# Patient Record
Sex: Female | Born: 1995
Health system: Southern US, Community
[De-identification: ages and names within clinical notes are randomized; demographics above are authoritative.]

## PROBLEM LIST (undated history)

## (undated) DIAGNOSIS — G8929 Other chronic pain: Secondary | ICD-10-CM

## (undated) DIAGNOSIS — F419 Anxiety disorder, unspecified: Secondary | ICD-10-CM

## (undated) DIAGNOSIS — F329 Major depressive disorder, single episode, unspecified: Secondary | ICD-10-CM

## (undated) DIAGNOSIS — R519 Headache, unspecified: Secondary | ICD-10-CM

## (undated) DIAGNOSIS — F32A Depression, unspecified: Secondary | ICD-10-CM

## (undated) DIAGNOSIS — T7840XA Allergy, unspecified, initial encounter: Secondary | ICD-10-CM

## (undated) HISTORY — DX: Other chronic pain: G89.29

## (undated) HISTORY — DX: Depression, unspecified: F32.A

## (undated) HISTORY — DX: Anxiety disorder, unspecified: F41.9

## (undated) HISTORY — DX: Allergy, unspecified, initial encounter: T78.40XA

## (undated) HISTORY — DX: Headache, unspecified: R51.9

## (undated) HISTORY — DX: Major depressive disorder, single episode, unspecified: F32.9

## (undated) HISTORY — PX: THERAPEUTIC ABORTION: SHX798

---

## 2015-04-15 ENCOUNTER — Ambulatory Visit (HOSPITAL_COMMUNITY)
Admission: AD | Admit: 2015-04-15 | Discharge: 2015-04-15 | Disposition: A | Discharge status: Home / Self Care | Payer: No Typology Code available for payment source | Attending: Psychiatry | Admitting: Psychiatry

## 2015-04-15 DIAGNOSIS — F419 Anxiety disorder, unspecified: Secondary | ICD-10-CM | POA: Diagnosis present
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
## 2015-08-02 DIAGNOSIS — F341 Dysthymic disorder: Secondary | ICD-10-CM | POA: Diagnosis not present

## 2015-10-04 ENCOUNTER — Ambulatory Visit (INDEPENDENT_AMBULATORY_CARE_PROVIDER_SITE_OTHER): Payer: BLUE CROSS/BLUE SHIELD | Admitting: Physician Assistant

## 2015-10-04 VITALS — BP 98/62 | HR 71 | Temp 98.3°F | Resp 16 | Ht 65.5 in | Wt 151.0 lb

## 2015-10-04 DIAGNOSIS — F329 Major depressive disorder, single episode, unspecified: Secondary | ICD-10-CM

## 2015-10-04 DIAGNOSIS — F32A Depression, unspecified: Secondary | ICD-10-CM

## 2015-10-04 MED ORDER — FLUOXETINE HCL 40 MG PO CAPS: 40.0000 mg | ORAL_CAPSULE | Freq: Every day | ORAL | Status: DC

## 2015-10-04 NOTE — Patient Instructions (Addendum)
Reche[place]. Continue therapy and follow up here with Benny LennertSarah Weber, PA-C.         IF you received an x-ray today, you will receive an invoice from [facility]. Please contact [facility] at [phone] with questions or concerns regarding your invoice.   IF you received labwork today, you will receive an invoice from [facility]. Please contact [facility] at [phone] with questions or concerns regarding your invoice.   Our billing staff will not be able to assist you with questions regarding bills from these companies.  You will be contacted with the lab results as soon as they are available. The fastest way to get your results is to activate your My Chart account. Instructions are located on the last page of this paperwork. If you have not heard from us regarding the results in 2 weeks, please contact this office.

## 2015-10-04 NOTE — Progress Notes (Signed)
Subjective:    Patient ID: Donna Hodges, female    DOB: 06/15/1995, 20 y.o.   MRN: [id number]  HPI Patient presents toaday for depression and medication refill. Pt classifies as transexual and is gender nonconforming. Pt is a patient of [facility], but the past two [facility]. Pt runs out of medicine tomorrow and from past experience, pt knows that they will not feel well without med and is afraid of harming oneself. It has been three months since pt has seen a provider for medication.   Earlier today pt thought about killing themselves (Due to gender nonconforming, patient refers to themselves as "them" vs "he or she"). While waiting in the lobby, older people were discussing how gay people shouldn't exist and talking loud about gay people. Pt responded and felt extremely upset afterwards. The pt then had thoughts of killing themselves due to other added stressors of the week like issues with their mother but currently does not have intent or plan.   Takes prozac 20 mg for 6 mths. Was increased from 10 to 20 two months ago. Has not seen a huge difference with the increase. Does state has more energy.Notes that they have been finding joy in things such as gardening, journaling, and hanging out with friends.  Has not noticed any change in interest levels since beginning meds. Feeels sad 3 d/wk and on these days will just lay in bed or eat food. Pt stats that when they are really sad they think of harming themselves and does things like put pressure on their leg to feel better but no other form of self harm. Since medication admin, pt has had 5-6 thoughts of killing themselves with no intent or plan. Pt has not activley attempted to kill themselves.   Of note, pt quit school last semester, felt like they couldn't write papers anymore.Is currently unemployed. Typically has a good relationship with family but is having problems with mom and friends and feels like they do not have as  many friends anymore.   Tovi Hanock is [facility]. Pt sees therapist via webcam every two weeks and has consistently done so for the past two months.    Pt also mentions that they currently have multiple sex partners, both female and female, but only uses condom sometimes. Is not currently pregnant. Was tested for STDs yesterday.    Review of Systems  Constitutional: Positive for fatigue. Negative for fever and unexpected weight change.  Respiratory: Negative for cough, choking, chest tightness and shortness of breath.   Neurological: Positive for dizziness and light-headedness.  Psychiatric/Behavioral: Positive for suicidal ideas (today) and sleep disturbance (did not sleep well last night). Negative for hallucinations and self-injury. The patient is nervous/anxious.        Objective:   Physical Exam  Constitutional: She is oriented to person, place, and time. She appears well-developed and well-nourished.  HENT:  Head: Normocephalic and atraumatic.  Eyes: Pupils are equal, round, and reactive to light.  Cardiovascular: Normal rate, regular rhythm, normal heart sounds and normal pulses.   Pulmonary/Chest: Effort normal and breath sounds normal.  Lymphadenopathy:       Head (right side): No tonsillar, no preauricular and no posterior auricular adenopathy present.       Head (left side): No tonsillar, no preauricular and no posterior auricular adenopathy present.    She has no cervical adenopathy.  Neurological: She is alert and oriented to person, place, and time.  Psychiatric: Her  speech is normal. Her mood appears anxious. She is withdrawn. She exhibits a depressed mood. She expresses no suicidal ideation.    Depression screen PHQ 2/9 10/04/2015  Decreased Interest 1  Down, Depressed, Hopeless 1  PHQ - 2 Score 2  Altered sleeping 1  Tired, decreased energy 1  Change in appetite 2  Feeling bad or failure about yourself  2  Trouble concentrating 1  Moving slowly  or fidgety/restless 1  Suicidal thoughts 1  PHQ-9 Score 11        Assessment & Plan:  1. Depression -Pt notes that depression has gotten better since medication was started, notes a 60% increase in mood. However, pt has not noticed much improvement with the increase from 10-20 mg. Will increase meds to 40mg . Pt chooses to follow up with Benny LennertSarah Weber, PA-C at the end of July before pt leaves for a trip in August. Instructed that if symptoms worsen to seek medical care sooner.  -Prescribed FLUoxetine (PROZAC) 40 MG capsule; Take 1 capsule (40 mg total) by mouth daily.  Dispense: 90 capsule; Refill: 0

## 2015-10-07 ENCOUNTER — Emergency Department (HOSPITAL_COMMUNITY)
Admission: EM | Admit: 2015-10-07 | Discharge: 2015-10-07 | Disposition: A | Discharge status: Other Acute Inpatient Hospital | Payer: BLUE CROSS/BLUE SHIELD | Attending: Emergency Medicine | Admitting: Emergency Medicine

## 2015-10-07 ENCOUNTER — Encounter (HOSPITAL_COMMUNITY): Payer: Self-pay | Admitting: *Deleted

## 2015-10-07 ENCOUNTER — Observation Stay (HOSPITAL_COMMUNITY)
Admission: AD | Admit: 2015-10-07 | Discharge: 2015-10-08 | Disposition: A | Discharge status: Home / Self Care | Payer: BLUE CROSS/BLUE SHIELD | Source: Intra-hospital | Attending: Psychiatry | Admitting: Psychiatry

## 2015-10-07 DIAGNOSIS — Z79899 Other long term (current) drug therapy: Secondary | ICD-10-CM | POA: Insufficient documentation

## 2015-10-07 DIAGNOSIS — F321 Major depressive disorder, single episode, moderate: Principal | ICD-10-CM | POA: Diagnosis present

## 2015-10-07 DIAGNOSIS — F1290 Cannabis use, unspecified, uncomplicated: Secondary | ICD-10-CM | POA: Diagnosis not present

## 2015-10-07 DIAGNOSIS — F32A Depression, unspecified: Secondary | ICD-10-CM

## 2015-10-07 DIAGNOSIS — Z91018 Allergy to other foods: Secondary | ICD-10-CM | POA: Insufficient documentation

## 2015-10-07 DIAGNOSIS — F329 Major depressive disorder, single episode, unspecified: Secondary | ICD-10-CM

## 2015-10-07 DIAGNOSIS — R45851 Suicidal ideations: Secondary | ICD-10-CM

## 2015-10-07 DIAGNOSIS — Z818 Family history of other mental and behavioral disorders: Secondary | ICD-10-CM | POA: Diagnosis not present

## 2015-10-07 DIAGNOSIS — F419 Anxiety disorder, unspecified: Secondary | ICD-10-CM | POA: Diagnosis not present

## 2015-10-07 LAB — COMPREHENSIVE METABOLIC PANEL
ALT: 18 U/L (ref 14–54)
AST: 23 U/L (ref 15–41)
Albumin: 4.3 g/dL (ref 3.5–5.0)
Alkaline Phosphatase: 37 U/L — ABNORMAL LOW (ref 38–126)
Anion gap: 6 (ref 5–15)
BUN: 13 mg/dL (ref 6–20)
CO2: 26 mmol/L (ref 22–32)
Calcium: 9.5 mg/dL (ref 8.9–10.3)
Chloride: 105 mmol/L (ref 101–111)
Creatinine, Ser: 0.68 mg/dL (ref 0.44–1.00)
GFR calc Af Amer: >60 mL/min (ref >60)
GFR calc non Af Amer: >60 mL/min (ref >60)
Glucose, Bld: 84 mg/dL (ref 65–99)
Potassium: 4 mmol/L (ref 3.5–5.1)
Sodium: 137 mmol/L (ref 135–145)
Total Bilirubin: 0.7 mg/dL (ref 0.3–1.2)
Total Protein: 8 g/dL (ref 6.5–8.1)

## 2015-10-07 LAB — ETHANOL: Alcohol, Ethyl (B): <5 mg/dL (ref <5)

## 2015-10-07 LAB — RAPID URINE DRUG SCREEN, HOSP PERFORMED
Amphetamines: NOT DETECTED
Barbiturates: NOT DETECTED
Benzodiazepines: NOT DETECTED
Cocaine: NOT DETECTED
Opiates: NOT DETECTED
Tetrahydrocannabinol: NOT DETECTED

## 2015-10-07 LAB — CBC
HCT: 41.3 % (ref 36.0–46.0)
Hemoglobin: 14 g/dL (ref 12.0–15.0)
MCH: 27.9 pg (ref 26.0–34.0)
MCHC: 33.9 g/dL (ref 30.0–36.0)
MCV: 82.4 fL (ref 78.0–100.0)
Platelets: 226 10*3/uL (ref 150–400)
RBC: 5.01 MIL/uL (ref 3.87–5.11)
RDW: 12.8 % (ref 11.5–15.5)
WBC: 4.5 10*3/uL (ref 4.0–10.5)

## 2015-10-07 LAB — POC URINE PREG, ED: Preg Test, Ur: NEGATIVE

## 2015-10-07 MED ORDER — ONDANSETRON HCL 4 MG PO TABS: 4.0000 mg | ORAL_TABLET | Freq: Three times a day (TID) | ORAL | Status: DC | PRN

## 2015-10-07 MED ORDER — ZOLPIDEM TARTRATE 5 MG PO TABS
5.0000 mg | ORAL_TABLET | Freq: Every day | ORAL | Status: DC
  Administered 2015-10-07: 5 mg via ORAL
  Filled 2015-10-07: qty 1

## 2015-10-07 MED ORDER — FLUCONAZOLE 100 MG PO TABS
150.0000 mg | ORAL_TABLET | Freq: Once | ORAL | Status: AC
  Administered 2015-10-07: 150 mg via ORAL
  Filled 2015-10-07 (×2): qty 1.5

## 2015-10-07 MED ORDER — MAGNESIUM HYDROXIDE 400 MG/5ML PO SUSP: 30.0000 mL | Freq: Every day | ORAL | Status: DC | PRN

## 2015-10-07 MED ORDER — LORAZEPAM 1 MG PO TABS: 1.0000 mg | ORAL_TABLET | Freq: Three times a day (TID) | ORAL | Status: DC | PRN

## 2015-10-07 MED ORDER — IBUPROFEN 600 MG PO TABS: 600.0000 mg | ORAL_TABLET | Freq: Three times a day (TID) | ORAL | Status: DC | PRN

## 2015-10-07 MED ORDER — ALUM & MAG HYDROXIDE-SIMETH 200-200-20 MG/5ML PO SUSP: 30.0000 mL | ORAL | Status: DC | PRN

## 2015-10-07 MED ORDER — ACETAMINOPHEN 325 MG PO TABS: 650.0000 mg | ORAL_TABLET | Freq: Four times a day (QID) | ORAL | Status: DC | PRN

## 2015-10-07 MED ORDER — HYDROXYZINE HCL 25 MG PO TABS
25.0000 mg | ORAL_TABLET | Freq: Four times a day (QID) | ORAL | Status: DC | PRN
  Administered 2015-10-07: 25 mg via ORAL
  Filled 2015-10-07: qty 1

## 2015-10-07 MED ORDER — IBUPROFEN 200 MG PO TABS: 600.0000 mg | ORAL_TABLET | Freq: Three times a day (TID) | ORAL | Status: DC | PRN
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
## 2015-10-07 NOTE — Progress Notes (Signed)
D.  Pt pleasant but guarded on approach, denies complaints at this time.  Pt has remained in bed, briefly sat up and watched TV for short time.  A.  Support and encouragement offered, medication given as ordered  R.  Pt remains safe on unit, will continue to monitor.

## 2015-10-07 NOTE — ED Notes (Signed)
Pt states she began feeling suicidal last night. Pt called suicide hotline, states she felt better but then had suicidal thoughts again this morning. Pt started taking 40mg  Prozac 3 days ago, which she states made her feel worse. Pt states she feels alone and has less friends than before. Pt recently quit school at [employer] due to depression and is unemployed. Pt denies drug or alcohol use.

## 2015-10-07 NOTE — ED Provider Notes (Signed)
CSN: [id number]     Arrival date & time 10/07/15  1155 History   First MD Initiated Contact with Patient 10/07/15 1236     Chief Complaint  Patient presents with  . Suicidal    Patient is a 20 y.o. female presenting with mental health disorder. The history is provided by the patient.  Mental Health Problem Presenting symptoms: suicidal thoughts   Degree of incapacity (severity):  Moderate Onset quality:  Gradual Duration:  1 day Timing:  Constant Progression:  Worsening Chronicity:  New Relieved by:  Nothing Worsened by:  Nothing tried Associated symptoms: no abdominal pain, no chest pain and no headaches   Patient reports increasing SI over past day No suicide attempt as of yet No other complaints She denies fever/vomiting/cp/HA/abd pain  Past Medical History  Diagnosis Date  . Anxiety   . Depression   . Allergy     Seasonal allergies   History reviewed. No pertinent past surgical history. Family History  Problem Relation Age of Onset  . Diabetes Mother   . Cancer Father   . Hyperlipidemia Father   . Mental illness Sister   . Diabetes Brother   . Mental illness Brother    Social History  Substance Use Topics  . Smoking status: Never Smoker   . Smokeless tobacco: Never Used  . Alcohol Use: No   OB History    No data available     Review of Systems  Constitutional: Negative for fever.  Cardiovascular: Negative for chest pain.  Gastrointestinal: Negative for abdominal pain.  Neurological: Negative for headaches.  Psychiatric/Behavioral: Positive for suicidal ideas.  All other systems reviewed and are negative.     Allergies  Soy allergy  Home Medications   Prior to Admission medications   Medication Sig Start Date End Date Taking? Authorizing Provider  FLUoxetine (PROZAC) 40 MG capsule Take 1 capsule (40 mg total) by mouth daily. 10/04/15  Yes Magdalene RiverBrittany D Wiseman, PA-C  MICONAZOLE NITRATE VAGINAL (MONISTAT 3) 4 % CREA Place 1 application vaginally  daily.   Yes Historical Provider, MD   BP 104/65 mmHg  Pulse 72  Temp(Src) 98.3 F (36.8 C) (Oral)  Resp 18  SpO2 100%  LMP 09/13/2015 Physical Exam CONSTITUTIONAL: Well developed/well nourished HEAD: Normocephalic/atraumatic EYES: EOMI ENMT: Mucous membranes moist NECK: supple no meningeal signs SPINE/BACK:entire spine nontender CV: S1/S2 noted, no murmurs/rubs/gallops noted LUNGS: Lungs are clear to auscultation bilaterally, no apparent distress ABDOMEN: soft, nontender NEURO: Pt is awake/alert/appropriate, moves all extremitiesx4.  No facial droop.   EXTREMITIES: pulses normal/equal, full ROM SKIN: warm, color normal PSYCH: flat affect  ED Course  Procedures  Labs Review Labs Reviewed  COMPREHENSIVE METABOLIC PANEL - Abnormal; Notable for the following:    Alkaline Phosphatase 37 (*)    All other components within normal limits  ETHANOL  CBC  URINE RAPID DRUG SCREEN, HOSP PERFORMED  POC URINE PREG, ED    I have personally reviewed and evaluated these lab results as part of my medical decision-making.     Pt medically stable at this time Appropriate for psych evaluation  MDM   Final diagnoses:  Major depressive disorder, single episode, moderate degree (HCC)    Nursing notes including past medical history and social history reviewed and considered in documentation Labs/vital reviewed myself and considered during evaluation     Zadie Rhineonald Grayce Budden, MD 10/07/15 1404

## 2015-10-08 DIAGNOSIS — F321 Major depressive disorder, single episode, moderate: Secondary | ICD-10-CM | POA: Diagnosis not present

## 2015-10-08 MED ORDER — FLUOXETINE HCL 20 MG PO CAPS
40.0000 mg | ORAL_CAPSULE | Freq: Every day | ORAL | Status: DC
  Administered 2015-10-08: 40 mg via ORAL

## 2015-10-08 MED ORDER — FLUOXETINE HCL 20 MG PO CAPS
ORAL_CAPSULE | ORAL | Status: AC
  Filled 2015-10-08: qty 2

## 2015-10-08 MED ORDER — FLUOXETINE HCL 40 MG PO CAPS: 40.0000 mg | ORAL_CAPSULE | Freq: Every day | ORAL | Status: DC

## 2015-10-08 MED ORDER — MICONAZOLE NITRATE 4 % VA CREA: 1.0000 "application " | TOPICAL_CREAM | Freq: Every day | VAGINAL | Status: DC

## 2015-10-08 NOTE — Progress Notes (Signed)
Pt d/c. Family member to pick up. All items returned. D/C instructions given.
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
## 2015-10-08 NOTE — Discharge Instructions (Addendum)
Outpatient resources given Counselor provided

## 2015-10-08 NOTE — Progress Notes (Signed)
D:Pt reports that she recently had her prozac medication increased. Pt reports that she started feeling suicidal a couple days ago calling the suicide hotline feeling better then feeling worse the next day. Pt currently denies si thoughts. A:Offered support and observation. R:Pt denies hi. Safety maintained on the unit.

## 2015-10-18 ENCOUNTER — Ambulatory Visit: Payer: Self-pay | Admitting: Certified Nurse Midwife

## 2015-10-30 ENCOUNTER — Ambulatory Visit (INDEPENDENT_AMBULATORY_CARE_PROVIDER_SITE_OTHER): Payer: BLUE CROSS/BLUE SHIELD | Admitting: Physician Assistant

## 2015-10-30 VITALS — BP 122/72 | HR 85 | Temp 99.0°F | Resp 17 | Ht 65.5 in | Wt 149.0 lb

## 2015-10-30 DIAGNOSIS — F32A Depression, unspecified: Secondary | ICD-10-CM

## 2015-10-30 DIAGNOSIS — G4700 Insomnia, unspecified: Secondary | ICD-10-CM | POA: Diagnosis not present

## 2015-10-30 DIAGNOSIS — F329 Major depressive disorder, single episode, unspecified: Secondary | ICD-10-CM

## 2015-10-30 MED ORDER — FLUOXETINE HCL 40 MG PO CAPS: 40.0000 mg | ORAL_CAPSULE | Freq: Every day | ORAL | Status: DC

## 2015-10-30 NOTE — Patient Instructions (Addendum)
Melatonin oral solid dosage forms What is this medicine? MELATONIN (mel uh TOH nin) is a dietary supplement. It is mostly promoted to help maintain normal sleep patterns. The FDA has not approved this supplement for any medical use. This supplement may be used for other purposes; ask your health care provider or pharmacist if you have questions. This medicine may be used for other purposes; ask your health care provider or pharmacist if you have questions. What should I tell my health care provider before I take this medicine? They need to know if you have any of these conditions: -asthma -cancer -depression or mental illness -diabetes -hormone problems -if you often drink alcohol -immune system problems -liver disease -organ transplant -seizure disorder -an unusual or allergic reaction to melatonin, other medicines, foods, dyes, or preservatives -pregnant or trying to get pregnant -breast-feeding How should I use this medicine? Take this supplement by mouth with a glass of water. Do not take with food. This supplement is usually taken 1 or 2 hours before bedtime. After taking this supplement, limit your activities to those needed to prepare for bed. Some products may be chewed or dissolved in the mouth before swallowing. Some tablets or capsules must be swallowed whole; do not cut, crush or chew. Follow the directions on the package labeling, or take as directed by your health care professional. Do not take this supplement more often than directed. Talk to your pediatrician regarding the use of this supplement in children. Special care may be needed. This supplement is not recommended for use in children without a prescription. Overdosage: If you think you have taken too much of this medicine contact a poison control center or emergency room at once. NOTE: This medicine is only for you. Do not share this medicine with others. What if I miss a dose? If you miss taking your dose at the usual  time, skip that dose. If it is almost time for your next dose, take only that dose. Do not take double or extra doses. What may interact with this medicine? Do not take this medicine with any of the following medications: -fluvoxamine -ramelteon -tasimelteon This medicine may also interact with the following medications: -alcohol -atazanavir -caffeine -carbamazepine -certain antibiotics like ciprofloxacin, enoxacin -certain medicines for depression, anxiety, or psychotic disturbances -cimetidine -female hormones, like estrogens and birth control pills, patches, rings, or injections -methoxsalen -nifedipine -other medications for sleep -other herbal or dietary supplements -phenobarbital -rifampin -smoking tobacco -tamoxifen -treatments for cancer, organ transplant, or immune disorders This list may not describe all possible interactions. Give your health care provider a list of all the medicines, herbs, non-prescription drugs, or dietary supplements you use. Also tell them if you smoke, drink alcohol, or use illegal drugs. Some items may interact with your medicine. What should I watch for while using this medicine? See your doctor if your symptoms do not get better or if they get worse. Do not take this supplement for more than 2 weeks unless your doctor tells you to. You may get drowsy or dizzy. Do not drive, use machinery, or do anything that needs mental alertness until you know how this medicine affects you. Do not stand or sit up quickly, especially if you are an older patient. This reduces the risk of dizzy or fainting spells. Alcohol may interfere with the effect of this medicine. Avoid alcoholic drinks. Talk to your doctor before you use this supplement if you are currently being treated for an emotional, mental, or sleep problem. This medicine  may interfere with your treatment. Herbal or dietary supplements are not regulated like medicines. Rigid quality control standards are  not required for dietary supplements. The purity and strength of these products can vary. The safety and effect of this dietary supplement for a certain disease or illness is not well known. This product is not intended to diagnose, treat, cure or prevent any disease. The Food and Drug Administration suggests the following to help consumers protect themselves: -Always read product labels and follow directions. -Natural does not mean a product is safe for humans to take. -Look for products that include USP after the ingredient name. This means that the manufacturer followed the standards of the US Pharmacopoeia. -Supplements made or sold by a nationally known food or drug company are more likely to be made under tight controls. You can write to the company for more information about how the product was made. What side effects may I notice from receiving this medicine? Side effects that you should report to your doctor or health care professional as soon as possible: -allergic reactions like skin rash, itching or hives, swelling of the face, lips, or tongue -breathing problems -change in sex drive or performance -confusion -depressed mood, irritable, or other changes in moods or behaviors -fast, irregular heartbeat -feeling faint or lightheaded, falls -irregular or missed menstrual periods -leakage of milk from the nipples in a person who is not breast-feeding -signs and symptoms of liver injury like dark yellow or brown urine; general ill feeling or flu-like symptoms; light-colored stools; loss of appetite; nausea; right upper belly pain; unusually weak or tired; yellowing of the eyes or skin -sleep-walking -trouble staying awake or alert during the day -unusual activities while you are still asleep like driving, eating, making phone calls -unusual bleeding or bruising Side effects that usually do not require medical attention (report to your doctor or health care professional if they continue  or are bothersome): -dizziness -drowsiness -headache -tiredness -unusual dreams or nightmares -upset stomach This list may not describe all possible side effects. Call your doctor for medical advice about side effects. You may report side effects to FDA at 1-800-FDA-1088. Where should I keep my medicine? Keep out of the reach of children. Store at room temperature or as directed on the package label. Protect from moisture. Throw away any unused supplement after the expiration date. NOTE: This sheet is a summary. It may not cover all possible information. If you have questions about this medicine, talk to your doctor, pharmacist, or health care provider.    2016, Elsevier/Gold Standard. (2014-12-13 09:44:59)   Also [facility] with you just in case ([facility] "sleep aid") [facility]

## 2015-10-30 NOTE — Progress Notes (Signed)
Donna Hodges  MRN: [id number] DOB: July 05, 1995  Subjective:  Donna Hodges is a 20 y.o. female, who is gender noncomforming (pronoun "they" and "them"), seen in office today for follow up on depression. Was seen 10/04/15 with worsening depression symptoms. Prozac was increased from 20 to 40 mg. Three days later pt was admitted to behavioral health from the ED for suicidal ideation (with no plan).   Since 10/07/15 admission, pt notes they are doing much better. Believes that it took a few days for the  to get in their system but now that they have been taking it for almost a month, their depression has much improved. Pt notes they are feeling more energetic, engaging in more social activities, has had no suicidal ideations,  and is using coping mechanisms such as journaling. Pt still seeing therapist via webcam every 2 weeks.   Of note, pt's sleep has gotten worse since increasing to . Has no issues falling asleep but has issues staying asleep throughout the entire night. Has tried drinking sleepy tea with no relief. 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 They will be gone for 2 months and needs a refill on Prozac prior to this trip.  Review of Systems  Constitutional: Negative for unexpected weight change.  Psychiatric/Behavioral: Negative for agitation. The patient is not nervous/anxious and is not hyperactive.     Patient Active Problem List   Diagnosis Date Noted  . Major depressive disorder, single episode, moderate degree (HCC) 10/07/2015  . Major depressive disorder, single episode, moderate (HCC) 10/07/2015    Current Outpatient Prescriptions on File Prior to Visit  Medication Sig Dispense Refill  . FLUoxetine (PROZAC) 40 MG capsule Take 1 capsule (40 mg total) by mouth daily. 90 capsule 0   No current facility-administered medications on file prior to visit.    Allergies  Allergen Reactions   . Soy Allergy Other (See Comments)    "throat itches"     Objective:  BP 122/72 mmHg  Pulse 85  Temp(Src) 99 F (37.2 C) (Oral)  Resp 17  Ht 5' 5.5" (1.664 m)  Wt 149 lb (67.586 kg)  BMI 24.41 kg/m2  SpO2 99%  LMP 10/09/2015  Physical Exam  Constitutional: She is oriented to person, place, and time and well-developed, well-nourished, and in no distress.  HENT:  Head: Normocephalic and atraumatic.  Eyes: Conjunctivae are normal.  Neck: Normal range of motion.  Pulmonary/Chest: Effort normal.  Neurological: She is alert and oriented to person, place, and time. Gait normal.  Skin: Skin is warm and dry.  Psychiatric: Affect normal.  Vitals reviewed.   Assessment and Plan :   1. Depression - FLUoxetine (PROZAC) 40 MG capsule; Take 1 capsule (40 mg total) by mouth daily.  Dispense: 90 capsule; Refill: 0 -Discussed coping mechanisms for pt to use while away on trip, recommended to continue journaling, continue therapy webcam sessions while away on trip, and continue reaching out and communicating with close family members and friends 2. Insomnia  -Discussed proper sleep hygiene techniques -Melatonin OTC prn for sleep -Will consider bellsomra after patient returns from trip no improvement with melatonin and sleep hygiene -Follow up in 3 months   Total of 25 minutes spent with pt, greater than 50% of which was spent in discussion of tx plan for depression.   Benjiman Core PA-C  [facility] 10/30/2015 10:55 AM

## 2016-04-29 ENCOUNTER — Ambulatory Visit (INDEPENDENT_AMBULATORY_CARE_PROVIDER_SITE_OTHER): Payer: Self-pay | Admitting: Physician Assistant

## 2016-04-29 VITALS — BP 122/72 | HR 88 | Temp 98.8°F | Resp 17 | Ht 66.5 in | Wt 166.0 lb

## 2016-04-29 DIAGNOSIS — F329 Major depressive disorder, single episode, unspecified: Secondary | ICD-10-CM | POA: Diagnosis not present

## 2016-04-29 DIAGNOSIS — F32A Depression, unspecified: Secondary | ICD-10-CM

## 2016-04-29 MED ORDER — ESCITALOPRAM OXALATE 10 MG PO TABS: ORAL_TABLET | ORAL | 1 refills | Status: DC

## 2016-04-29 NOTE — Progress Notes (Signed)
Donna DollyLucia Virtue  MRN: [id number] DOB: 09/18/1995  Subjective:  Donna Hodges is a 21 y.o. female, who is gender noncomforming (pronoun "they" and "them"), seen in office today for follow up on depression. Pt last seen on 10/30/15 and given enough medication for three months as pt was going on a trip for an extended period.   Since last visit, they were feeling much better so they tapered themselves off of fluoxetine. They have been off for a month and a half.   Pt notes that their depression has come back. They are sleeping a whole lot, having low energy, and getting upset a lot easier. Notes the coping mechanisms they established while on prozac are not really helping now. They have had four suicidal thoughts in the past 1.5 months, with no plan. They are not suicidal today.   They are also not seeing their therapist anymore. Felt as if it wasn't working anymore so quit seeing them. They are interested in seeing someone else.    Pt would also like to try something other than prozac at this time as when prozac was being increased they would have major suicidal thoughts, one event which was so bad that [facility].   Review of Systems  Per HPI  Patient Active Problem List   Diagnosis Date Noted  . Major depressive disorder, single episode, moderate degree (HCC) 10/07/2015  . Major depressive disorder, single episode, moderate (HCC) 10/07/2015    Current Outpatient Prescriptions on File Prior to Visit  Medication Sig Dispense Refill  . FLUoxetine (PROZAC) 40 MG capsule Take 1 capsule (40 mg total) by mouth daily. (Patient not taking: Reported on 04/29/2016) 90 capsule 0   No current facility-administered medications on file prior to visit.     Allergies  Allergen Reactions  . Soy Allergy Other (See Comments)    "throat itches"      Objective:  BP 122/72 (BP Location: Right Arm, Patient Position: Sitting, Cuff Size: Normal)   Pulse 88   Temp 98.8 F (37.1 C) (Oral)    Resp 17   Ht 5' 6.5" (1.689 m)   Wt 166 lb (75.3 kg)   LMP 04/08/2016 (Approximate)   SpO2 100%   BMI 26.39 kg/m   Physical Exam  Constitutional: She is oriented to person, place, and time and well-developed, well-nourished, and in no distress.  HENT:  Head: Normocephalic and atraumatic.  Eyes: Conjunctivae are normal.  Neck: Normal range of motion.  Pulmonary/Chest: Effort normal.  Neurological: She is alert and oriented to person, place, and time. Gait normal.  Skin: Skin is warm and dry.  Psychiatric: Affect normal.  Vitals reviewed.   Assessment and Plan :  1. Depression, unspecified depression type -Pt instructed to use 5mg  x 4 days then increase to 10mg  daily. Follow up in 4 weeks for reevaluation. Will likely need to increase to 20mg  at this time. Discussed plan of action if pt begins to have suicidal thoughts when starting/increasing this medication. Also given contact information for local therapists. Instructed to set up an appointment with a therapist before our follow up in 4 weeks. Pt agrees.  - escitalopram (LEXAPRO) 10 MG tablet; Take 5mg  x 4 days, then increase to 10mg  daily.  Dispense: 30 tablet; Refill: 1  A total of 25 minutes was spent in the room with the patient, greater than 50% of which was in treatment options and plan for depression.   Benjiman CoreBrittany Rondia Higginbotham PA-C  [facility] 04/29/2016 8:48 AM

## 2016-04-29 NOTE — Patient Instructions (Addendum)
Take lexapro 5mg  x 4 days, then increase to 10mg  daily until you come back in 4 weeks. At this time we may need to increase to 20mg . Make appointment for four weeks today when you leave. If you start to feel suicidal please go to ER immediately. Call our office if you have any questions. Below are numbers for therapists. Please call around and do some research on who you would like to start talking to. I would like you to tell me who you are seeing/going to see at our next appointment. Thank you for letting me participate in your health and well being.    Call [facility] and set up an appointment for therapy Address: [address], [place], [place] [address]  9AM-5:30PM                       Phone: [phone]   [facility] [address] [place], [place] [address]  Shanon RosserBarbara Farran [phone]  Maris BergerKathy Kirstner [phone]  Marco CollieSusan Kroll-Smith [phone]   IF you received an x-ray today, you will receive an invoice from [facility]. Please contact [facility] at [phone] with questions or concerns regarding your invoice.   IF you received labwork today, you will receive an invoice from [facility]. Please contact [facility] at [phone] with questions or concerns regarding your invoice.   Our billing staff will not be able to assist you with questions regarding bills from these companies.  You will be contacted with the lab results as soon as they are available. The fastest way to get your results is to activate your My Chart account. Instructions are located on the last page of this paperwork. If you have not heard from us regarding the results in 2 weeks, please contact this office.

## 2016-05-07 ENCOUNTER — Telehealth: Payer: Self-pay

## 2016-05-07 NOTE — Telephone Encounter (Signed)
Pt is needing to talk with brittany wiseman only about taking her antidepressant meds  Since she just found out that she is pregnant  Best number [phone]

## 2016-05-07 NOTE — Telephone Encounter (Signed)
Please advise.  Covering provider?

## 2016-05-08 ENCOUNTER — Ambulatory Visit: Payer: Self-pay | Admitting: Family Medicine

## 2016-05-08 NOTE — Telephone Encounter (Signed)
Patient plans on getting an abortion. I advised her that this medication is safe to take during pregnancy. She may continue this. Recommended f/u in 4 weeks with PA-Wiseman as discussed at her OV.

## 2016-05-28 ENCOUNTER — Telehealth: Payer: Self-pay

## 2016-05-28 ENCOUNTER — Other Ambulatory Visit: Payer: Self-pay | Admitting: Physician Assistant

## 2016-05-28 ENCOUNTER — Ambulatory Visit: Payer: BLUE CROSS/BLUE SHIELD | Admitting: Physician Assistant

## 2016-05-28 DIAGNOSIS — F329 Major depressive disorder, single episode, unspecified: Secondary | ICD-10-CM

## 2016-05-28 DIAGNOSIS — F32A Depression, unspecified: Secondary | ICD-10-CM

## 2016-05-28 MED ORDER — ESCITALOPRAM OXALATE 10 MG PO TABS: ORAL_TABLET | ORAL | 2 refills | Status: DC

## 2016-05-28 MED ORDER — ESCITALOPRAM OXALATE 10 MG PO TABS: 10.0000 mg | ORAL_TABLET | Freq: Every day | ORAL | 1 refills | Status: DC

## 2016-05-28 NOTE — Telephone Encounter (Signed)
Pt would like Donna Hodges to know she had to cancel appt today 05/28/16 for financial reasons. She would like to discuss her anti-depression meds with you. Reference 05/07/16 phone message.  Please advise:  [phone]

## 2016-05-28 NOTE — Telephone Encounter (Signed)
fyi

## 2016-05-29 NOTE — Telephone Encounter (Signed)
Pt contacted via phone. She states she cannot afford to come in due to financial issues. She only has a few days left of her lexapro and does not want to run. I have sent in additional refills. Since her last visit with me (04/29/16), she became pregnant, had an abortion, got kicked out of her house, and moved in with old roommates across town. Notes she has been really down but is not having any thoughts or plans of suicide. States she thinks the medication is helping but due to her recent life events she has still been very depressed. We discussed increasing lexapro to 20mg  daily as she is on 10mg  at this time. I informed her that if she starts to experience any negative side effects on 20mg  to decrease back to 10mg . Pt understands. She will call me back in 2 weeks and report how she is doing. We also discussed the dire need of her getting into therapy again. She states she will work on that over the next couple of weeks. Instructed that if she needs anything to call our office.   Meds ordered this encounter  Medications  . escitalopram (LEXAPRO) 10 MG tablet    Sig: Take 20mg  daily, if experiencing side effects, decrease to 10mg  daily.    Dispense:  60 tablet    Refill:  2    Order Specific Question:   Supervising Provider    Answer:   Ethelda ChickSMITH, KRISTI M [[id number]]

## 2016-07-05 ENCOUNTER — Other Ambulatory Visit: Payer: Self-pay

## 2016-07-05 DIAGNOSIS — F329 Major depressive disorder, single episode, unspecified: Secondary | ICD-10-CM

## 2016-07-05 DIAGNOSIS — F32A Depression, unspecified: Secondary | ICD-10-CM

## 2016-07-05 MED ORDER — ESCITALOPRAM OXALATE 10 MG PO TABS: ORAL_TABLET | ORAL | 0 refills | Status: DC

## 2016-10-01 ENCOUNTER — Encounter: Payer: Self-pay | Admitting: Behavioral Health

## 2016-10-01 ENCOUNTER — Telehealth: Payer: Self-pay | Admitting: Behavioral Health

## 2016-10-01 NOTE — Telephone Encounter (Signed)
Pre-Visit Call completed with patient and chart updated.   Pre-Visit Info documented in Specialty Comments under SnapShot.    

## 2016-10-02 ENCOUNTER — Other Ambulatory Visit (HOSPITAL_COMMUNITY)
Admission: RE | Admit: 2016-10-02 | Discharge: 2016-10-02 | Disposition: A | Discharge status: Home / Self Care | Payer: BLUE CROSS/BLUE SHIELD | Source: Ambulatory Visit | Attending: Family Medicine | Admitting: Family Medicine

## 2016-10-02 ENCOUNTER — Ambulatory Visit (INDEPENDENT_AMBULATORY_CARE_PROVIDER_SITE_OTHER): Payer: Self-pay | Admitting: Family Medicine

## 2016-10-02 VITALS — BP 103/56 | HR 73 | Temp 98.0°F | Ht 65.0 in | Wt 159.2 lb

## 2016-10-02 DIAGNOSIS — B379 Candidiasis, unspecified: Secondary | ICD-10-CM

## 2016-10-02 DIAGNOSIS — B373 Candidiasis of vulva and vagina: Secondary | ICD-10-CM | POA: Insufficient documentation

## 2016-10-02 DIAGNOSIS — N898 Other specified noninflammatory disorders of vagina: Secondary | ICD-10-CM

## 2016-10-02 DIAGNOSIS — F32A Depression, unspecified: Secondary | ICD-10-CM

## 2016-10-02 DIAGNOSIS — K625 Hemorrhage of anus and rectum: Secondary | ICD-10-CM

## 2016-10-02 DIAGNOSIS — K591 Functional diarrhea: Secondary | ICD-10-CM

## 2016-10-02 DIAGNOSIS — F329 Major depressive disorder, single episode, unspecified: Secondary | ICD-10-CM | POA: Diagnosis not present

## 2016-10-02 DIAGNOSIS — M26609 Unspecified temporomandibular joint disorder, unspecified side: Secondary | ICD-10-CM | POA: Diagnosis not present

## 2016-10-02 DIAGNOSIS — Z124 Encounter for screening for malignant neoplasm of cervix: Secondary | ICD-10-CM

## 2016-10-02 MED ORDER — ESCITALOPRAM OXALATE 10 MG PO TABS: ORAL_TABLET | ORAL | 3 refills | Status: DC

## 2016-10-02 MED ORDER — METHOCARBAMOL 500 MG PO TABS: 500.0000 mg | ORAL_TABLET | Freq: Every evening | ORAL | 0 refills | Status: DC | PRN

## 2016-10-02 NOTE — Progress Notes (Addendum)
[facility] [address], [address] [place], [place] [address] [phone]  Date:  10/02/2016   Name:  Donna Hodges   DOB:  1995/11/06   MRN:  [id number]  PCP:  Pearline Cables, MD    Chief Complaint: Establish Care (Pt here to est care. Pt will need refill. Pt has states that she has a few concerns that she would like to discuss today. )   History of Present Illness:  Donna Hodges is a 21 y.o. very pleasant female patient who presents with the following:  Here today to establish care with [place] She works at [employer].  She is originally from [place], but has lived in [place] for the last 10 years    She did have an abortion back in February- she took "the pill", never went back for her follow-up but has not noted any complications  Her menses have returned so we presume the abortion is complete   She does sometimes have some vaginal itching and discharge- wonders if she might have a yeast infection  Never a smoker She enjoys yoga, gardening, meditation  She started taking prozac about 2 years ago- this was the first time she sought care for her depression. She then changed to lexapro and feels that the lexapro works well for her She is sleeping well, but admits that she does not have a lot of energy. She thinks that this may be just how she is made.  She has noted jaw pain for years.  She saw DDS this am- her teeth seem to be ok.  They did not happen to talk about TMJ.  Notes that sometimes the jaw tightness seems to radiate into her upper back  We last did labs for her about a year ago- looked ok Never had a pap yet- we can do this for her today  She has noted sx of hemorrhoids for a year or so- she will have a little bleeding with BM off and on, also persistent diarrhea for a couple of months.  No mucus noted in her stool She is not aware of any family history of Crohn disease   Patient Active Problem List   Diagnosis  Date Noted  . Major depressive disorder, single episode, moderate degree (HCC) 10/07/2015  . Major depressive disorder, single episode, moderate (HCC) 10/07/2015    Past Medical History:  Diagnosis Date  . Allergy    Seasonal allergies  . Anxiety   . Depression     Past Surgical History:  Procedure Laterality Date  . THERAPEUTIC ABORTION      Social History  Substance Use Topics  . Smoking status: Never Smoker  . Smokeless tobacco: Never Used  . Alcohol use No    Family History  Problem Relation Age of Onset  . Diabetes Mother   . Cancer Father   . Hyperlipidemia Father   . Mental illness Sister   . Mental illness Brother   . Cancer Maternal Grandmother        liver cancer    Allergies  Allergen Reactions  . Soy Allergy Other (See Comments)    "throat itches"     Medication list has been reviewed and updated.  Current Outpatient Prescriptions on File Prior to Visit  Medication Sig Dispense Refill  . escitalopram (LEXAPRO) 10 MG tablet Take 20mg  daily, if experiencing side effects, decrease to 10mg  daily. 180 tablet 0   No current facility-administered  medications on file prior to visit.     Review of Systems:  As per HPI- otherwise negative.  No fever, chills, CP, SOB, nausea, vomiting, rash, ST or cough    Physical Examination: Vitals:   10/02/16 1403  BP: (!) 103/56  Pulse: 73  Temp: 98 F (36.7 C)   Vitals:   10/02/16 1403  Weight: 159 lb 3.2 oz (72.2 kg)  Height: 5\' 5"  (1.651 m)   Body mass index is 26.49 kg/m. Ideal Body Weight: Weight in (lb) to have BMI = 25: 149.9  GEN: WDWN, NAD, Non-toxic, A & O x 3, looks well, slight overweight HEENT: Atraumatic, Normocephalic. Neck supple. No masses, No LAD.  Bilateral TM wnl, oropharynx normal.  PEERL,EOMI.   Tenderness with pressure over the bilateral TMJ She does have tenderness with palpation over the TMJ bilaterally Ears and Nose: No external deformity. CV: RRR, No M/G/R. No JVD. No  thrill. No extra heart sounds. PULM: CTA B, no wheezes, crackles, rhonchi. No retractions. No resp. distress. No accessory muscle use. ABD: S, NT, ND, +BS. No rebound. No HSM. EXTR: No c/c/e NEURO Normal gait.  PSYCH: Normally interactive. Conversant. Not depressed or anxious appearing.  Calm demeanor. Pelvic: normal, no vaginal lesions or discharge. Uterus normal, no CMT, no adnexal tendereness or masses   Assessment and Plan: Screening for cervical cancer - Plan: Cytology - PAP  Depression, unspecified depression type - Plan: escitalopram (LEXAPRO) 10 MG tablet  Vaginal discharge - Plan: Cervicovaginal ancillary only  TMJ (temporomandibular joint disorder) - Plan: methocarbamol (ROBAXIN) 500 MG tablet  Functional diarrhea - Plan: Ambulatory referral to Gastroenterology  BRBPR (bright red blood per rectum) - Plan: Ambulatory referral to Gastroenterology  Here today to establish care and discuss a few concerns Refilled her lexapro today Pap pending Will have her try a low dose of robaxin at night for her TMJ for a week or so Referral to GI for bleeding and diarrhea  Will plan further follow- up pending labs.   Signed Abbe AmsterdamJessica Leng Montesdeoca, MD  Received her labs 6/26.  Called and LMOM- pap is negative, all ok except for yeast infection,  Will rx diflucan for her and mail copy of labs.  Let me know if any questions  Results for orders placed or performed in visit on 10/02/16  Cervicovaginal ancillary only  Result Value Ref Range   Bacterial vaginitis Negative for Bacterial Vaginitis Microorganisms    Candida vaginitis **POSITIVE for Candida species** (A)    Chlamydia Negative    Neisseria gonorrhea Negative    Trichomonas Negative   Cytology - PAP  Result Value Ref Range   Adequacy      Satisfactory for evaluation  endocervical/transformation zone component PRESENT.   Diagnosis      NEGATIVE FOR INTRAEPITHELIAL LESIONS OR MALIGNANCY.   Diagnosis      FUNGAL ORGANISMS  PRESENT CONSISTENT WITH CANDIDA SPP.   Material Submitted CervicoVaginal Pap [ThinPrep Imaged]

## 2016-10-02 NOTE — Patient Instructions (Signed)
It was good to see you today- I will in touch with your pap and swab asap Try the robaxin as needed for your jaw pain- take one at bedtime for a week or so and let me know how it works for you. This medication can make you a bit sleepy, do not use when you need to drive  I will also refer you to GI to evaluate your bleeding and diarrhea  Take care!

## 2016-10-06 LAB — CERVICOVAGINAL ANCILLARY ONLY
Bacterial vaginitis: NEGATIVE
Candida vaginitis: POSITIVE — AB
Chlamydia: NEGATIVE
Neisseria Gonorrhea: NEGATIVE
Trichomonas: NEGATIVE

## 2016-10-06 LAB — CYTOLOGY - PAP: Diagnosis: NEGATIVE

## 2016-10-07 ENCOUNTER — Encounter: Payer: Self-pay | Admitting: Family Medicine

## 2016-10-07 MED ORDER — FLUCONAZOLE 150 MG PO TABS: 150.0000 mg | ORAL_TABLET | Freq: Once | ORAL | 0 refills | Status: AC

## 2016-10-07 NOTE — Addendum Note (Signed)
Addended by: Abbe AmsterdamOPLAND, JESSICA C on: 10/07/2016 01:31 PM   Modules accepted: Orders

## 2016-10-16 ENCOUNTER — Emergency Department (HOSPITAL_COMMUNITY): Payer: BLUE CROSS/BLUE SHIELD

## 2016-10-16 ENCOUNTER — Emergency Department (HOSPITAL_COMMUNITY)
Admission: EM | Admit: 2016-10-16 | Discharge: 2016-10-16 | Disposition: A | Discharge status: Home / Self Care | Payer: BLUE CROSS/BLUE SHIELD | Attending: Emergency Medicine | Admitting: Emergency Medicine

## 2016-10-16 DIAGNOSIS — M545 Low back pain, unspecified: Secondary | ICD-10-CM

## 2016-10-16 DIAGNOSIS — Y92410 Unspecified street and highway as the place of occurrence of the external cause: Secondary | ICD-10-CM | POA: Diagnosis not present

## 2016-10-16 DIAGNOSIS — Y999 Unspecified external cause status: Secondary | ICD-10-CM | POA: Insufficient documentation

## 2016-10-16 DIAGNOSIS — S4991XA Unspecified injury of right shoulder and upper arm, initial encounter: Secondary | ICD-10-CM | POA: Diagnosis not present

## 2016-10-16 DIAGNOSIS — Y9355 Activity, bike riding: Secondary | ICD-10-CM | POA: Insufficient documentation

## 2016-10-16 DIAGNOSIS — R52 Pain, unspecified: Secondary | ICD-10-CM

## 2016-10-16 DIAGNOSIS — R51 Headache: Secondary | ICD-10-CM | POA: Diagnosis not present

## 2016-10-16 DIAGNOSIS — M25511 Pain in right shoulder: Secondary | ICD-10-CM | POA: Diagnosis not present

## 2016-10-16 DIAGNOSIS — M549 Dorsalgia, unspecified: Secondary | ICD-10-CM | POA: Diagnosis not present

## 2016-10-16 DIAGNOSIS — Z79899 Other long term (current) drug therapy: Secondary | ICD-10-CM | POA: Insufficient documentation

## 2016-10-16 DIAGNOSIS — S3992XA Unspecified injury of lower back, initial encounter: Secondary | ICD-10-CM | POA: Diagnosis not present

## 2016-10-16 DIAGNOSIS — M5440 Lumbago with sciatica, unspecified side: Secondary | ICD-10-CM | POA: Diagnosis not present

## 2016-10-16 DIAGNOSIS — M25519 Pain in unspecified shoulder: Secondary | ICD-10-CM

## 2016-10-16 LAB — URINALYSIS, ROUTINE W REFLEX MICROSCOPIC
Bilirubin Urine: NEGATIVE
Glucose, UA: NEGATIVE mg/dL
Hgb urine dipstick: NEGATIVE
Ketones, ur: 20 mg/dL — AB
Leukocytes, UA: NEGATIVE
Nitrite: NEGATIVE
Protein, ur: NEGATIVE mg/dL
Specific Gravity, Urine: 1.011 (ref 1.005–1.030)
pH: 5 (ref 5.0–8.0)

## 2016-10-16 LAB — PREGNANCY, URINE: Preg Test, Ur: NEGATIVE

## 2016-10-16 NOTE — Discharge Instructions (Signed)
Please read attached information regarding your condition. Take ibuprofen or Tylenol as needed for pain and inflammation. Follow-up with PCP for further evaluation. Return to ED for worsening pain, weakness, numbness, loss of bladder function, additional injury or inability to walk.
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
## 2017-05-28 DIAGNOSIS — K6289 Other specified diseases of anus and rectum: Secondary | ICD-10-CM | POA: Diagnosis not present

## 2017-05-28 DIAGNOSIS — R110 Nausea: Secondary | ICD-10-CM | POA: Diagnosis not present

## 2017-08-19 DIAGNOSIS — R0602 Shortness of breath: Secondary | ICD-10-CM | POA: Diagnosis not present

## 2017-08-19 DIAGNOSIS — R05 Cough: Secondary | ICD-10-CM | POA: Diagnosis not present

## 2017-08-19 DIAGNOSIS — J069 Acute upper respiratory infection, unspecified: Secondary | ICD-10-CM | POA: Diagnosis not present
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
## 2017-09-10 ENCOUNTER — Encounter: Payer: Self-pay | Admitting: Family Medicine

## 2017-09-10 ENCOUNTER — Ambulatory Visit: Payer: BLUE CROSS/BLUE SHIELD | Admitting: Family Medicine

## 2017-09-10 VITALS — BP 118/70 | HR 96 | Resp 16 | Ht 65.0 in | Wt 141.4 lb

## 2017-09-10 DIAGNOSIS — F329 Major depressive disorder, single episode, unspecified: Secondary | ICD-10-CM | POA: Diagnosis not present

## 2017-09-10 DIAGNOSIS — K649 Unspecified hemorrhoids: Secondary | ICD-10-CM | POA: Diagnosis not present

## 2017-09-10 DIAGNOSIS — F32A Depression, unspecified: Secondary | ICD-10-CM

## 2017-09-10 DIAGNOSIS — M533 Sacrococcygeal disorders, not elsewhere classified: Secondary | ICD-10-CM | POA: Diagnosis not present

## 2017-09-10 DIAGNOSIS — G8929 Other chronic pain: Secondary | ICD-10-CM | POA: Diagnosis not present

## 2017-09-10 MED ORDER — ESCITALOPRAM OXALATE 10 MG PO TABS: ORAL_TABLET | ORAL | 3 refills | Status: DC

## 2017-09-10 MED ORDER — MELOXICAM 7.5 MG PO TABS: 7.5000 mg | ORAL_TABLET | Freq: Every day | ORAL | 1 refills | Status: DC

## 2017-09-10 NOTE — Patient Instructions (Signed)
Good to see you again today- take care!  We will start you back on lexapro at 10 mg a day- let me know if you feel like you need to go up or down on this dose We will try meloxicam daily for 2 weeks- then as needed- for your SI joint pain I am going to refer you to GI to lok at your hemorroids.    Let me know if any problems or concerns

## 2017-10-08 ENCOUNTER — Encounter: Payer: Self-pay | Admitting: Family Medicine

## 2017-10-21 DIAGNOSIS — F4321 Adjustment disorder with depressed mood: Secondary | ICD-10-CM | POA: Diagnosis not present

## 2017-10-24 NOTE — Progress Notes (Addendum)
[facility] at [facility] [address] [place], [place] [address] [phone]  Date:  10/26/2017   Name:  Braylea Brancato   DOB:  [birth year]   MRN:  [id number]  PCP:  Pearline Cables, MD    Chief Complaint: Depression (anti-depressent medication, not really working); Urinary Hesitancy; and Dizziness (when standing increased dizziness, going on for a whole, worsening)   History of Present Illness:  Donna Hodges is a 22 y.o. very pleasant female patient who presents with the following:  History of depression- here today with several concerns. Last seen by myself in May of this year for hemorrhoids and we also put her back on lexapro at that time.  She had send me a message about a month later and I did advise her to increase her lexapro to 20 mg   She notes that she has "always" had a hard time passing urine, but this has gotten worse over the last 2-3 months. No pain.  No hematuria No urgency She feels like she voids fully- it is just hard to get her stream going. Sometimes she may go to void, and then "give up and try again in an hour."  We started her back on lexapro and increased dose to 20. She notes that she is still feeling very tired- she may sleep 10- 12 hours a night. Generally she has needed less sleep historically - like 8-9 hours  She does feel like her mood is overall improved but not quite where she would like to be.  Some day she will feel "like I don't feel happy or sad, just nothing."  No seizure history She wonders about wellbutrin which we could certainly try adding to her regiemn  She may feel lightheaded if she stands up quickly.    This is not really new but may be a bit more pronounced    Patient Active Problem List   Diagnosis Date Noted  . Major depressive disorder, single episode, moderate degree (HCC) 10/07/2015  . Major depressive disorder, single episode, moderate (HCC) 10/07/2015    Past Medical  History:  Diagnosis Date  . Allergy    Seasonal allergies  . Anxiety   . Depression     Past Surgical History:  Procedure Laterality Date  . THERAPEUTIC ABORTION      Social History   Tobacco Use  . Smoking status: Never Smoker  . Smokeless tobacco: Never Used  Substance Use Topics  . Alcohol use: No    Alcohol/week: 0.0 oz  . Drug use: Yes    Frequency: 1.0 times per week    Types: Marijuana    Comment: Marijuana bowl two times a week for nausea     Family History  Problem Relation Age of Onset  . Diabetes Mother   . Cancer Father   . Hyperlipidemia Father   . Mental illness Sister   . Mental illness Brother   . Cancer Maternal Grandmother        liver cancer    Allergies  Allergen Reactions  . Soy Allergy Other (See Comments)    "throat itches"     Medication list has been reviewed and updated.  Current Outpatient Medications on File Prior to Visit  Medication Sig Dispense Refill  . meloxicam (MOBIC) 7.5 MG tablet Take 1 tablet (7.5 mg total) by mouth daily. Take for 2 weeks, then as needed for SI joint pain 30 tablet 1   No current  facility-administered medications on file prior to visit.     Review of Systems:  As per HPI- otherwise negative. LMP was 2 weeks ago No fever or chills No CP or SOB No SI   Physical Examination: Vitals:   10/26/17 1545  BP: 108/72  Pulse: 74  Resp: 16  SpO2: 99%   Vitals:   10/26/17 1545  Weight: 136 lb (61.7 kg)  Height: 5\' 5"  (1.651 m)   Body mass index is 22.63 kg/m. Ideal Body Weight: Weight in (lb) to have BMI = 25: 149.9  GEN: WDWN, NAD, Non-toxic, A & O x 3, normal weight, looks well  HEENT: Atraumatic, Normocephalic. Neck supple. No masses, No LAD.  Bilateral TM wnl, oropharynx normal.  PEERL,EOMI.   Ears and Nose: No external deformity. CV: RRR, No M/G/R. No JVD. No thrill. No extra heart sounds. PULM: CTA B, no wheezes, crackles, rhonchi. No retractions. No resp. distress. No accessory muscle  use. ABD: S, NT, ND. No rebound. No HSM. EXTR: No c/c/e NEURO Normal gait.  PSYCH: Normally interactive. Conversant. Not depressed or anxious appearing.  Calm demeanor.   Results for orders placed or performed in visit on 10/26/17  POCT Urinalysis Dipstick (Automated)  Result Value Ref Range   Color, UA YELLOW    Clarity, UA CLEAR    Glucose, UA Negative Negative   Bilirubin, UA NEGATIVE    Ketones, UA NEGATIVE    Spec Grav, UA 1.010 1.010 - 1.025   Blood, UA NEGATIVE    pH, UA 6.0 5.0 - 8.0   Protein, UA Negative Negative   Urobilinogen, UA 0.2 0.2 or 1.0 E.U./dL   Nitrite, UA NEGATIVE    Leukocytes, UA Negative Negative   Laying 90/60, P 60 Sitting 92/60, pulse 70 Standing 93/60, pulse 70  Assessment and Plan: Urinary frequency - Plan: POCT Urinalysis Dipstick (Automated), Urine Culture  Other fatigue - Plan: CBC, Comprehensive metabolic panel, Hemoglobin A1c, Epstein-Barr virus VCA antibody panel, TSH  Depression, unspecified depression type - Plan: buPROPion (WELLBUTRIN SR) 100 MG 12 hr tablet, escitalopram (LEXAPRO) 20 MG tablet  Lightheaded  Urinary sx- etiology is not clear.  UA looks ok, order culture.  Check renal function Offered to send her to urology for further eval but she declines for now  Will obtain labs for fatigue w/u as above Add wellbutrin sr 100 BID to her lexapro Will plan further follow- up pending labs. Likely mild postural hypotension.  Discussed adding fluid and salt to her diet- she will let me know if not helpful for her   Signed Abbe Amsterdam, MD   Received her labs 7/16 Results for orders placed or performed in visit on 10/26/17  CBC  Result Value Ref Range   WBC 4.1 4.0 - 10.5 K/uL   RBC 4.71 3.87 - 5.11 Mil/uL   Platelets 190.0 150.0 - 400.0 K/uL   Hemoglobin 13.3 12.0 - 15.0 g/dL   HCT 40.3 47.4 - 25.9 %   MCV 83.9 78.0 - 100.0 fl   MCHC 33.7 30.0 - 36.0 g/dL   RDW 56.3 87.5 - 64.3 %  Comprehensive metabolic panel  Result  Value Ref Range   Sodium 138 135 - 145 mEq/L   Potassium 4.4 3.5 - 5.1 mEq/L   Chloride 101 96 - 112 mEq/L   CO2 31 19 - 32 mEq/L   Glucose, Bld 77 70 - 99 mg/dL   BUN 8 6 - 23 mg/dL   Creatinine, Ser 3.29 0.40 - 1.20 mg/dL  Total Bilirubin 0.3 0.2 - 1.2 mg/dL   Alkaline Phosphatase 47 39 - 117 U/L   AST 15 0 - 37 U/L   ALT 9 0 - 35 U/L   Total Protein 7.4 6.0 - 8.3 g/dL   Albumin 4.3 3.5 - 5.2 g/dL   Calcium 9.7 8.4 - 21.310.5 mg/dL   GFR 086.57116.87 >84.69>60.00 mL/min  Hemoglobin A1c  Result Value Ref Range   Hgb A1c MFr Bld 5.1 4.6 - 6.5 %  Epstein-Barr virus VCA antibody panel  Result Value Ref Range   EBV VCA IgM <36.00 U/mL   EBV VCA IgG 101.00 (H) U/mL   EBV NA IgG >600.00 (H) U/mL   Interpretation    TSH  Result Value Ref Range   TSH 0.88 0.35 - 4.50 uIU/mL  POCT Urinalysis Dipstick (Automated)  Result Value Ref Range   Color, UA YELLOW    Clarity, UA CLEAR    Glucose, UA Negative Negative   Bilirubin, UA NEGATIVE    Ketones, UA NEGATIVE    Spec Grav, UA 1.010 1.010 - 1.025   Blood, UA NEGATIVE    pH, UA 6.0 5.0 - 8.0   Protein, UA Negative Negative   Urobilinogen, UA 0.2 0.2 or 1.0 E.U./dL   Nitrite, UA NEGATIVE    Leukocytes, UA Negative Negative   Message to pt   Just waiting on your urine culture to come back in.   Blood counts are normal Metabolic profile is normal A1c does not suggest diabetes EBV titer shows that you have already had mono Thyroid is normal Please let me know how adding the wellbutrin does for you! Please come and see me in the office in about 3 months to check on how you are doing   addnd 7/17-  Received her urine culture  Another message sent to pt with these results

## 2017-10-26 ENCOUNTER — Ambulatory Visit: Payer: BLUE CROSS/BLUE SHIELD | Admitting: Family Medicine

## 2017-10-26 ENCOUNTER — Encounter: Payer: Self-pay | Admitting: Family Medicine

## 2017-10-26 VITALS — BP 108/72 | HR 74 | Resp 16 | Ht 65.0 in | Wt 136.0 lb

## 2017-10-26 DIAGNOSIS — R350 Frequency of micturition: Secondary | ICD-10-CM

## 2017-10-26 DIAGNOSIS — F32A Depression, unspecified: Secondary | ICD-10-CM

## 2017-10-26 DIAGNOSIS — F329 Major depressive disorder, single episode, unspecified: Secondary | ICD-10-CM | POA: Diagnosis not present

## 2017-10-26 DIAGNOSIS — R42 Dizziness and giddiness: Secondary | ICD-10-CM

## 2017-10-26 DIAGNOSIS — R5383 Other fatigue: Secondary | ICD-10-CM

## 2017-10-26 LAB — POC URINALSYSI DIPSTICK (AUTOMATED)
Bilirubin, UA: NEGATIVE
Blood, UA: NEGATIVE
Glucose, UA: NEGATIVE
Ketones, UA: NEGATIVE
Leukocytes, UA: NEGATIVE
Nitrite, UA: NEGATIVE
Protein, UA: NEGATIVE
Spec Grav, UA: 1.01 (ref 1.010–1.025)
Urobilinogen, UA: 0.2 E.U./dL
pH, UA: 6 (ref 5.0–8.0)

## 2017-10-26 MED ORDER — BUPROPION HCL ER (SR) 100 MG PO TB12: 100.0000 mg | ORAL_TABLET | Freq: Two times a day (BID) | ORAL | 6 refills | Status: DC

## 2017-10-26 MED ORDER — ESCITALOPRAM OXALATE 20 MG PO TABS: ORAL_TABLET | ORAL | 3 refills | Status: DC

## 2017-10-26 NOTE — Patient Instructions (Signed)
I will be in touch with your labs asap Let's try adding wellbutrin to your current lexapro- start with 1 pill daily for 3 days then increase to twice a day  For your low blood pressure/ lightheaded feeling please try increasing your fluid and salt intake- gatorade can also be helpful. If you do not see improvement in these symptoms please do alert me!

## 2017-10-27 ENCOUNTER — Encounter: Payer: Self-pay | Admitting: Family Medicine

## 2017-10-27 LAB — COMPREHENSIVE METABOLIC PANEL
ALT: 9 U/L (ref 0–35)
AST: 15 U/L (ref 0–37)
Albumin: 4.3 g/dL (ref 3.5–5.2)
Alkaline Phosphatase: 47 U/L (ref 39–117)
BUN: 8 mg/dL (ref 6–23)
CO2: 31 mEq/L (ref 19–32)
Calcium: 9.7 mg/dL (ref 8.4–10.5)
Chloride: 101 mEq/L (ref 96–112)
Creatinine, Ser: 0.67 mg/dL (ref 0.40–1.20)
GFR: 116.87 mL/min (ref >60.00)
Glucose, Bld: 77 mg/dL (ref 70–99)
Potassium: 4.4 mEq/L (ref 3.5–5.1)
Sodium: 138 mEq/L (ref 135–145)
Total Bilirubin: 0.3 mg/dL (ref 0.2–1.2)
Total Protein: 7.4 g/dL (ref 6.0–8.3)

## 2017-10-27 LAB — CBC
HCT: 39.5 % (ref 36.0–46.0)
Hemoglobin: 13.3 g/dL (ref 12.0–15.0)
MCHC: 33.7 g/dL (ref 30.0–36.0)
MCV: 83.9 fl (ref 78.0–100.0)
Platelets: 190 10*3/uL (ref 150.0–400.0)
RBC: 4.71 Mil/uL (ref 3.87–5.11)
RDW: 14.5 % (ref 11.5–15.5)
WBC: 4.1 10*3/uL (ref 4.0–10.5)

## 2017-10-27 LAB — URINE CULTURE
MICRO NUMBER:: 90834944
SPECIMEN QUALITY:: ADEQUATE

## 2017-10-27 LAB — HEMOGLOBIN A1C: Hgb A1c MFr Bld: 5.1 % (ref 4.6–6.5)

## 2017-10-27 LAB — EPSTEIN-BARR VIRUS VCA ANTIBODY PANEL
EBV NA IgG: >600 U/mL — ABNORMAL HIGH
EBV VCA IgG: 101 U/mL — ABNORMAL HIGH
EBV VCA IgM: <36 U/mL

## 2017-10-27 LAB — TSH: TSH: 0.88 u[IU]/mL (ref 0.35–4.50)

## 2017-10-28 ENCOUNTER — Encounter: Payer: Self-pay | Admitting: Family Medicine

## 2017-11-13 ENCOUNTER — Encounter: Payer: Self-pay | Admitting: Family Medicine

## 2017-11-20 ENCOUNTER — Ambulatory Visit: Payer: Self-pay | Admitting: Family

## 2017-12-13 ENCOUNTER — Other Ambulatory Visit: Payer: Self-pay | Admitting: Family Medicine

## 2017-12-13 DIAGNOSIS — F329 Major depressive disorder, single episode, unspecified: Secondary | ICD-10-CM

## 2017-12-13 DIAGNOSIS — F32A Depression, unspecified: Secondary | ICD-10-CM

## 2018-01-17 ENCOUNTER — Encounter: Payer: Self-pay | Admitting: Family Medicine

## 2018-02-18 ENCOUNTER — Encounter: Payer: BLUE CROSS/BLUE SHIELD | Admitting: Family Medicine

## 2018-02-23 DIAGNOSIS — F4321 Adjustment disorder with depressed mood: Secondary | ICD-10-CM | POA: Diagnosis not present
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
## 2018-03-16 DIAGNOSIS — F4321 Adjustment disorder with depressed mood: Secondary | ICD-10-CM | POA: Diagnosis not present

## 2018-05-19 DIAGNOSIS — F4321 Adjustment disorder with depressed mood: Secondary | ICD-10-CM | POA: Diagnosis not present

## 2018-06-29 ENCOUNTER — Encounter: Payer: Self-pay | Admitting: Family Medicine

## 2018-07-30 ENCOUNTER — Other Ambulatory Visit: Payer: Self-pay | Admitting: Family Medicine

## 2018-07-30 ENCOUNTER — Encounter: Payer: Self-pay | Admitting: Family Medicine

## 2018-07-30 DIAGNOSIS — F32A Depression, unspecified: Secondary | ICD-10-CM

## 2018-07-30 DIAGNOSIS — F329 Major depressive disorder, single episode, unspecified: Secondary | ICD-10-CM

## 2018-07-31 MED ORDER — BUPROPION HCL ER (SR) 100 MG PO TB12: 100.0000 mg | ORAL_TABLET | Freq: Two times a day (BID) | ORAL | 3 refills | Status: DC

## 2018-08-23 DIAGNOSIS — F4321 Adjustment disorder with depressed mood: Secondary | ICD-10-CM | POA: Diagnosis not present

## 2018-10-11 DIAGNOSIS — F4321 Adjustment disorder with depressed mood: Secondary | ICD-10-CM | POA: Diagnosis not present

## 2018-11-02 DIAGNOSIS — F4321 Adjustment disorder with depressed mood: Secondary | ICD-10-CM | POA: Diagnosis not present

## 2018-11-30 DIAGNOSIS — F4321 Adjustment disorder with depressed mood: Secondary | ICD-10-CM | POA: Diagnosis not present

## 2018-12-28 DIAGNOSIS — F4321 Adjustment disorder with depressed mood: Secondary | ICD-10-CM | POA: Diagnosis not present

## 2019-01-26 NOTE — Progress Notes (Addendum)
[facility] at [facility] [address], [address] [place], [place] [address] [phone]  Date:  01/27/2019   Name:  Hollynn Garno   DOB:  03/28/1996   MRN:  [id number]  PCP:  Pearline Cables, MD    Chief Complaint: Follow-up   History of Present Illness:  Donna Hodges is a 23 y.o. very pleasant female patient who presents with the following:  Patient with history of depression.  Here today for follow-up visit Last seen by myself in July of last year At that time we tried adding Wellbutrin to her current Lexapro She had contacted me with a possible concern and wished for medication change in October last year, but did not further follow-up She notes that she is overall doing well She works at [employer]- they were closed for a bit but now back open in person  She is taking wellbutrin and lexapro- she is overall doing well with this regimen She notes that she may feel tired even after a good night's rest. She does not snore She notes some mild sadness with hte change of sesons which is typical for her   Flu shot- she declines  Pap 2018- do next year  Tetanus vaccine- give today  Routine labs, HIV screening due  Patient Active Problem List   Diagnosis Date Noted  . Major depressive disorder, single episode, moderate degree (HCC) 10/07/2015  . Major depressive disorder, single episode, moderate (HCC) 10/07/2015    Past Medical History:  Diagnosis Date  . Allergy    Seasonal allergies  . Anxiety   . Depression     Past Surgical History:  Procedure Laterality Date  . THERAPEUTIC ABORTION      Social History   Tobacco Use  . Smoking status: Never Smoker  . Smokeless tobacco: Never Used  Substance Use Topics  . Alcohol use: No    Alcohol/week: 0.0 standard drinks  . Drug use: Yes    Frequency: 1.0 times per week    Types: Marijuana    Comment: Marijuana bowl two times a week for nausea     Family History   Problem Relation Age of Onset  . Diabetes Mother   . Cancer Father   . Hyperlipidemia Father   . Mental illness Sister   . Mental illness Brother   . Cancer Maternal Grandmother        liver cancer    Allergies  Allergen Reactions  . Soy Allergy Other (See Comments)    "throat itches"     Medication list has been reviewed and updated.  Current Outpatient Medications on File Prior to Visit  Medication Sig Dispense Refill  . buPROPion (WELLBUTRIN SR) 100 MG 12 hr tablet TAKE 1 TABLET BY MOUTH TWICE A DAY 180 tablet 0  . escitalopram (LEXAPRO) 20 MG tablet Take 1 daily 90 tablet 3   No current facility-administered medications on file prior to visit.     Review of Systems:  As per HPI- otherwise negative.   Physical Examination: Vitals:   01/27/19 1302  BP: 104/60  Pulse: 83  Resp: 16  Temp: 97.9 F (36.6 C)  SpO2: 98%   Vitals:   01/27/19 1302  Weight: 170 lb (77.1 kg)  Height: 5\' 5"  (1.651 m)   Body mass index is 28.29 kg/m. Ideal Body Weight: Weight in (lb) to have BMI = 25: 149.9  GEN: WDWN, NAD, Non-toxic, A & O x 3, overweight, looks well  HEENT: Atraumatic, Normocephalic. Neck supple. No masses, No LAD. Ears and Nose: No external deformity. CV: RRR, No M/G/R. No JVD. No thrill. No extra heart sounds. PULM: CTA B, no wheezes, crackles, rhonchi. No retractions. No resp. distress. No accessory muscle use. ABD: S, NT, ND, +BS. No rebound. No HSM. EXTR: No c/c/e NEURO Normal gait.  PSYCH: Normally interactive. Conversant. Not depressed or anxious appearing.  Calm demeanor.    Assessment and Plan: Screening for thyroid disorder - Plan: TSH  Screening for deficiency anemia - Plan: CBC  Screening for hyperlipidemia - Plan: Lipid panel  Screening for HIV (human immunodeficiency virus) - Plan: HIV Antibody (routine testing w rflx)  Screening for diabetes mellitus - Plan: Comprehensive metabolic panel, Hemoglobin A1c  Depression, unspecified  depression type - Plan: escitalopram (LEXAPRO) 20 MG tablet, buPROPion (WELLBUTRIN XL) 150 MG 24 hr tablet  Immunization due - Plan: CANCELED: Tdap vaccine greater than or equal to 7yo IM  Following up today  She would like to try once a day Wellbutrin which comes in 150 and 300 mg Wrote for 150 mg- she will try this and let me know what she thinks about this dose  Declines flu, gave td Will plan further follow- up pending labs.   Signed Lamar Blinks, MD Received her labs, message to patient  Results for orders placed or performed in visit on 01/27/19  CBC  Result Value Ref Range   WBC 4.3 4.0 - 10.5 K/uL   RBC 4.72 3.87 - 5.11 Mil/uL   Platelets 221.0 150.0 - 400.0 K/uL   Hemoglobin 13.2 12.0 - 15.0 g/dL   HCT 39.9 36.0 - 46.0 %   MCV 84.4 78.0 - 100.0 fl   MCHC 33.1 30.0 - 36.0 g/dL   RDW 13.4 11.5 - 15.5 %  Comprehensive metabolic panel  Result Value Ref Range   Sodium 136 135 - 145 mEq/L   Potassium 4.2 3.5 - 5.1 mEq/L   Chloride 103 96 - 112 mEq/L   CO2 27 19 - 32 mEq/L   Glucose, Bld 83 70 - 99 mg/dL   BUN 16 6 - 23 mg/dL   Creatinine, Ser 0.69 0.40 - 1.20 mg/dL   Total Bilirubin 0.3 0.2 - 1.2 mg/dL   Alkaline Phosphatase 39 39 - 117 U/L   AST 16 0 - 37 U/L   ALT 11 0 - 35 U/L   Total Protein 7.4 6.0 - 8.3 g/dL   Albumin 4.3 3.5 - 5.2 g/dL   Calcium 9.2 8.4 - 10.5 mg/dL   GFR 105.11 >60.00 mL/min  Hemoglobin A1c  Result Value Ref Range   Hgb A1c MFr Bld 5.3 4.6 - 6.5 %  Lipid panel  Result Value Ref Range   Cholesterol 154 0 - 200 mg/dL   Triglycerides 57.0 0.0 - 149.0 mg/dL   HDL 76.00 >39.00 mg/dL   VLDL 11.4 0.0 - 40.0 mg/dL   LDL Cholesterol 67 0 - 99 mg/dL   Total CHOL/HDL Ratio 2    NonHDL 78.44   TSH  Result Value Ref Range   TSH 0.91 0.35 - 4.50 uIU/mL

## 2019-01-26 NOTE — Patient Instructions (Addendum)
It was great to see you again today, I will be in touch with your labs ASAP We changed your wellbutrin to the once a day (XR) formula - this comes in 150 or 300 mg Start with the 150 but let me know if you want to increase  You got your tetanus booster today I did all your routine labs including your one time HIV screening (recommedned for all adults)  Take care!

## 2019-01-27 ENCOUNTER — Encounter: Payer: Self-pay | Admitting: Family Medicine

## 2019-01-27 ENCOUNTER — Ambulatory Visit (INDEPENDENT_AMBULATORY_CARE_PROVIDER_SITE_OTHER): Payer: BC Managed Care – PPO | Admitting: Family Medicine

## 2019-01-27 ENCOUNTER — Other Ambulatory Visit: Payer: Self-pay

## 2019-01-27 VITALS — BP 104/60 | HR 83 | Temp 97.9°F | Resp 16 | Ht 65.0 in | Wt 170.0 lb

## 2019-01-27 DIAGNOSIS — Z23 Encounter for immunization: Secondary | ICD-10-CM | POA: Diagnosis not present

## 2019-01-27 DIAGNOSIS — F329 Major depressive disorder, single episode, unspecified: Secondary | ICD-10-CM

## 2019-01-27 DIAGNOSIS — Z13220 Encounter for screening for lipoid disorders: Secondary | ICD-10-CM | POA: Diagnosis not present

## 2019-01-27 DIAGNOSIS — Z1329 Encounter for screening for other suspected endocrine disorder: Secondary | ICD-10-CM

## 2019-01-27 DIAGNOSIS — Z130 Encounter for screening for diseases of the blood and blood-forming organs and certain disorders involving the immune mechanism: Secondary | ICD-10-CM | POA: Diagnosis not present

## 2019-01-27 DIAGNOSIS — F32A Depression, unspecified: Secondary | ICD-10-CM

## 2019-01-27 DIAGNOSIS — Z131 Encounter for screening for diabetes mellitus: Secondary | ICD-10-CM | POA: Diagnosis not present

## 2019-01-27 DIAGNOSIS — Z114 Encounter for screening for human immunodeficiency virus [HIV]: Secondary | ICD-10-CM

## 2019-01-27 LAB — COMPREHENSIVE METABOLIC PANEL
ALT: 11 U/L (ref 0–35)
AST: 16 U/L (ref 0–37)
Albumin: 4.3 g/dL (ref 3.5–5.2)
Alkaline Phosphatase: 39 U/L (ref 39–117)
BUN: 16 mg/dL (ref 6–23)
CO2: 27 mEq/L (ref 19–32)
Calcium: 9.2 mg/dL (ref 8.4–10.5)
Chloride: 103 mEq/L (ref 96–112)
Creatinine, Ser: 0.69 mg/dL (ref 0.40–1.20)
GFR: 105.11 mL/min (ref >60.00)
Glucose, Bld: 83 mg/dL (ref 70–99)
Potassium: 4.2 mEq/L (ref 3.5–5.1)
Sodium: 136 mEq/L (ref 135–145)
Total Bilirubin: 0.3 mg/dL (ref 0.2–1.2)
Total Protein: 7.4 g/dL (ref 6.0–8.3)

## 2019-01-27 LAB — LIPID PANEL
Cholesterol: 154 mg/dL (ref 0–200)
HDL: 76 mg/dL (ref >39.00)
LDL Cholesterol: 67 mg/dL (ref 0–99)
NonHDL: 78.44
Total CHOL/HDL Ratio: 2
Triglycerides: 57 mg/dL (ref 0.0–149.0)
VLDL: 11.4 mg/dL (ref 0.0–40.0)

## 2019-01-27 LAB — CBC
HCT: 39.9 % (ref 36.0–46.0)
Hemoglobin: 13.2 g/dL (ref 12.0–15.0)
MCHC: 33.1 g/dL (ref 30.0–36.0)
MCV: 84.4 fl (ref 78.0–100.0)
Platelets: 221 10*3/uL (ref 150.0–400.0)
RBC: 4.72 Mil/uL (ref 3.87–5.11)
RDW: 13.4 % (ref 11.5–15.5)
WBC: 4.3 10*3/uL (ref 4.0–10.5)

## 2019-01-27 LAB — HEMOGLOBIN A1C: Hgb A1c MFr Bld: 5.3 % (ref 4.6–6.5)

## 2019-01-27 LAB — TSH: TSH: 0.91 u[IU]/mL (ref 0.35–4.50)

## 2019-01-27 MED ORDER — ESCITALOPRAM OXALATE 20 MG PO TABS: ORAL_TABLET | ORAL | 3 refills | Status: DC

## 2019-01-27 MED ORDER — BUPROPION HCL ER (XL) 150 MG PO TB24: 150.0000 mg | ORAL_TABLET | Freq: Every day | ORAL | 3 refills | Status: DC

## 2019-01-28 LAB — HIV ANTIBODY (ROUTINE TESTING W REFLEX): HIV 1&2 Ab, 4th Generation: NONREACTIVE

## 2019-02-02 DIAGNOSIS — Z20828 Contact with and (suspected) exposure to other viral communicable diseases: Secondary | ICD-10-CM | POA: Diagnosis not present

## 2019-03-10 ENCOUNTER — Encounter: Payer: Self-pay | Admitting: Family Medicine

## 2019-03-10 DIAGNOSIS — F32A Depression, unspecified: Secondary | ICD-10-CM

## 2019-03-10 DIAGNOSIS — F329 Major depressive disorder, single episode, unspecified: Secondary | ICD-10-CM

## 2019-03-11 MED ORDER — BUPROPION HCL ER (XL) 300 MG PO TB24: 300.0000 mg | ORAL_TABLET | Freq: Every day | ORAL | 6 refills | Status: DC

## 2019-03-11 NOTE — Addendum Note (Signed)
Addended by: Lamar Blinks C on: 03/11/2019 04:11 PM   Modules accepted: Orders

## 2019-03-26 ENCOUNTER — Other Ambulatory Visit: Payer: Self-pay | Admitting: Family Medicine

## 2019-03-26 DIAGNOSIS — F329 Major depressive disorder, single episode, unspecified: Secondary | ICD-10-CM

## 2019-03-26 DIAGNOSIS — F32A Depression, unspecified: Secondary | ICD-10-CM

## 2019-04-07 ENCOUNTER — Encounter: Payer: Self-pay | Admitting: Family Medicine

## 2019-04-16 ENCOUNTER — Encounter: Payer: Self-pay | Admitting: Family Medicine

## 2019-05-05 DIAGNOSIS — F4321 Adjustment disorder with depressed mood: Secondary | ICD-10-CM | POA: Diagnosis not present

## 2019-05-09 DIAGNOSIS — Z20828 Contact with and (suspected) exposure to other viral communicable diseases: Secondary | ICD-10-CM | POA: Diagnosis not present

## 2019-05-12 DIAGNOSIS — Z03818 Encounter for observation for suspected exposure to other biological agents ruled out: Secondary | ICD-10-CM | POA: Diagnosis not present

## 2019-05-21 ENCOUNTER — Encounter: Payer: Self-pay | Admitting: Family Medicine

## 2019-05-21 DIAGNOSIS — Z20828 Contact with and (suspected) exposure to other viral communicable diseases: Secondary | ICD-10-CM | POA: Diagnosis not present

## 2019-06-08 NOTE — Progress Notes (Signed)
[facility] at [facility], [place], [place] [address] [phone]  Date:  06/09/2019   Name:  Donna Hodges   DOB:  24-Jul-1995   MRN:  [id number]  PCP:  Darreld Mclean, MD    Chief Complaint: Nausea and STD testing   History of Present Illness:  Donna Hodges is a 24 y.o. very pleasant female patient who presents with the following:  Patient with history of depression, here today with concern of sleep difficulty, nausea, and STI screening  Last seen by myself in October At that time she was taking Wellbutrin and Lexapro and doing overall well-we changed her to once a day Wellbutrin at her last visit.  However patient states she is actually still taking Wellbutrin 150 twice a day, it seems they did not have the 300 mg strength available  Today she notes difficulty sleeping and sweating at night She moved to a new home and has been under some stress-notes increased problems sleeping for about 2 months Sweating at night for years  She would like STI screening but does not want to have her blood drawn today as she is menstruating-she feels like having a blood test during her cycle makes her feel too tired  She is feeling nauseated in the am for the last 3-4 weeks; this seemed to start after she had a bout of food poisoning  She vomited just once or twice several weeks ago She thinks she might have acid reflux She is not sexually active with men, states that pregnancy is not possible  For sleep she has tried valarian root and will sometimes smoke MJ  She is not really sure if she would like to change her antidepressant regimen at this time.  She wonders if a change might help her sleep better, but she is not really sure Overall she feels like her mood is under okay control  She is currently a Ship broker, she hopes to become an art therapist    Patient Active Problem List   Diagnosis Date Noted  . Major depressive  disorder, single episode, moderate degree ([place]) 10/07/2015  . Major depressive disorder, single episode, moderate ([place]) 10/07/2015    Past Medical History:  Diagnosis Date  . Allergy    Seasonal allergies  . Anxiety   . Depression     Past Surgical History:  Procedure Laterality Date  . THERAPEUTIC ABORTION      Social History   Tobacco Use  . Smoking status: Never Smoker  . Smokeless tobacco: Never Used  Substance Use Topics  . Alcohol use: No    Alcohol/week: 0.0 standard drinks  . Drug use: Yes    Frequency: 1.0 times per week    Types: Marijuana    Comment: Marijuana bowl two times a week for nausea     Family History  Problem Relation Age of Onset  . Diabetes Mother   . Cancer Father   . Hyperlipidemia Father   . Mental illness Sister   . Mental illness Brother   . Cancer Maternal Grandmother        liver cancer    Allergies  Allergen Reactions  . Soy Allergy Other (See Comments)    "throat itches"     Medication list has been reviewed and updated.  Current Outpatient Medications on File Prior to Visit  Medication Sig Dispense Refill  . buPROPion (WELLBUTRIN XL) 300 MG 24 hr tablet TAKE 1 TABLET BY  MOUTH EVERY DAY 90 tablet 3  . escitalopram (LEXAPRO) 20 MG tablet Take 1 daily 90 tablet 3   No current facility-administered medications on file prior to visit.    Review of Systems:  As per HPI- otherwise negative.   Physical Examination: Vitals:   06/09/19 1326  BP: 104/60  Pulse: 85  Resp: 16  Temp: (!) 97.4 F (36.3 C)  SpO2: 98%   Vitals:   06/09/19 1326  Weight: 164 lb (74.4 kg)  Height: '5\' 5"'  (1.651 m)   Body mass index is 27.29 kg/m. Ideal Body Weight: Weight in (lb) to have BMI = 25: 149.9  GEN: no acute distress.  Mild overweight, looks well    HEENT: Atraumatic, Normocephalic.  Ears and Nose: No external deformity. CV: RRR, No M/G/R. No JVD. No thrill. No extra heart sounds. PULM: CTA B, no wheezes, crackles, rhonchi.  No retractions. No resp. distress. No accessory muscle use. ABD: S, NT, ND, +BS. No rebound. No HSM.  Abdominal exam is benign EXTR: No c/c/e PSYCH: Normally interactive. Conversant.    Assessment and Plan: Routine screening for STI (sexually transmitted infection) - Plan: Urine cytology ancillary only([place]), Hepatitis C antibody, HIV Antibody (routine testing w rflx), RPR, Hepatitis B surface antibody,quantitative, Hepatitis B surface antigen  Depression, unspecified depression type  Nausea - Plan: CBC, Comprehensive metabolic panel  Sleeping difficulty  Routine STI screening ordered today.  Patient would like to have a urine screen for gonorrhea and chlamydia.  She plans to come back at a later date for blood draw She has noted some indistinct nausea following a possible bout of food poisoning several weeks ago.  Again, she does not want to have her blood drawn today.  I ordered a CBC and c-Met to be done at a later date.  She will let me know if the symptoms are worsening For sleep difficulty, I suggested trying melatonin or doxylamine OTC-she will let me know if not helpful I have asked her to keep a diary about her sleep and mood symptoms.  Perhaps she might give each day at an overall rating.  She will review her notes, this may help her decide if she wishes to change her medication or not  Moderate medical decision making today This visit occurred during the SARS-CoV-2 public health emergency.  Safety protocols were in place, including screening questions prior to the visit, additional usage of staff PPE, and extensive cleaning of exam room while observing appropriate contact time as indicated for disinfecting solutions.    Signed Lamar Blinks, MD

## 2019-06-09 ENCOUNTER — Ambulatory Visit (INDEPENDENT_AMBULATORY_CARE_PROVIDER_SITE_OTHER): Payer: BC Managed Care – PPO | Admitting: Family Medicine

## 2019-06-09 ENCOUNTER — Other Ambulatory Visit (HOSPITAL_COMMUNITY)
Admission: RE | Admit: 2019-06-09 | Discharge: 2019-06-09 | Disposition: A | Discharge status: Home / Self Care | Payer: BC Managed Care – PPO | Source: Ambulatory Visit | Attending: Family Medicine | Admitting: Family Medicine

## 2019-06-09 ENCOUNTER — Other Ambulatory Visit: Payer: Self-pay

## 2019-06-09 ENCOUNTER — Encounter: Payer: Self-pay | Admitting: Family Medicine

## 2019-06-09 VITALS — BP 104/60 | HR 85 | Temp 97.4°F | Resp 16 | Ht 65.0 in | Wt 164.0 lb

## 2019-06-09 DIAGNOSIS — Z113 Encounter for screening for infections with a predominantly sexual mode of transmission: Secondary | ICD-10-CM

## 2019-06-09 DIAGNOSIS — F329 Major depressive disorder, single episode, unspecified: Secondary | ICD-10-CM

## 2019-06-09 DIAGNOSIS — R110 Nausea: Secondary | ICD-10-CM

## 2019-06-09 DIAGNOSIS — G479 Sleep disorder, unspecified: Secondary | ICD-10-CM | POA: Diagnosis not present

## 2019-06-09 DIAGNOSIS — F32A Depression, unspecified: Secondary | ICD-10-CM

## 2019-06-09 NOTE — Patient Instructions (Signed)
It was good to see you again today Please come back for labs when you are able to - please call us and schedule a lab visit at your convenience  Try some melatonin or doxylamine as needed for sleep  Please let me know if this is not helpful for you

## 2019-06-10 LAB — URINE CYTOLOGY ANCILLARY ONLY
Chlamydia: NEGATIVE
Comment: NEGATIVE
Comment: NORMAL
Neisseria Gonorrhea: NEGATIVE

## 2019-06-11 ENCOUNTER — Encounter: Payer: Self-pay | Admitting: Family Medicine

## 2019-07-17 NOTE — Progress Notes (Addendum)
[facility] [place], [place], [place] [address] [phone]  Date:  07/18/2019   Name:  Donna Hodges   DOB:  [birth year]   MRN:  [id number]  PCP:  Darreld Mclean, MD    Chief Complaint: Night Sweats (blood tests, started few months ago)   History of Present Illness:  Donna Hodges is a 24 y.o. 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  Most recent labs in October, all looked okay at that time  She sees her accupunturist and he gave her some herbs- this did help her She is not quite sure what herbs she is taking Her last night sweat was over a week ago- previously she was getting them up to 3x a week  No unsual cough, no hemoptysis She has lost just a few lbs  Her menses are generally regular  She was born in [place]  No abd pain  She notes normal and regular MP Wt Readings from Last 3 Encounters:  07/18/19 164 lb (74.4 kg)  06/09/19 164 lb (74.4 kg)  01/27/19 170 lb (77.1 kg)     Patient Active Problem List   Diagnosis Date Noted  . Major depressive disorder, single episode, moderate degree ([place]) 10/07/2015  . Major depressive disorder, single episode, moderate ([place]) 10/07/2015    Past Medical History:  Diagnosis Date  . Allergy    Seasonal allergies  . Anxiety   . Depression     Past Surgical History:  Procedure Laterality Date  . THERAPEUTIC ABORTION      Social History   Tobacco Use  . Smoking status: Never Smoker  . Smokeless tobacco: Never Used  Substance Use Topics  . Alcohol use: No    Alcohol/week: 0.0 standard  drinks  . Drug use: Yes    Frequency: 1.0 times per week    Types: Marijuana    Comment: Marijuana bowl two times a week for nausea     Family History  Problem Relation Age of Onset  . Diabetes Mother   . Cancer Father   . Hyperlipidemia Father   . Mental illness Sister   . Mental illness Brother   . Cancer Maternal Grandmother        liver cancer    Allergies  Allergen Reactions  . Soy Allergy Other (See Comments)    "throat itches"     Medication list has been reviewed and updated.  Current Outpatient Medications on File Prior to Visit  Medication Sig Dispense Refill  . buPROPion (WELLBUTRIN XL) 300 MG 24 hr tablet TAKE 1 TABLET BY MOUTH EVERY DAY 90 tablet 3  . escitalopram (LEXAPRO) 20 MG tablet Take 1 daily 90 tablet 3   No current facility-administered medications on file prior to visit.    Review of Systems:  As per HPI- otherwise negative.   Physical Examination: Vitals:   07/18/19 1310  BP: 114/73  Pulse: 82  Resp: 17  Temp: 97.9 F (36.6 C)  SpO2: 98%   Vitals:   07/18/19 1310  Weight: 164 lb (74.4 kg)  Height: 5\' 5"  (1.651 m)   Body mass index is 27.29 kg/m. Ideal Body Weight: Weight in (lb) to have BMI = 25: 149.9  GEN: no acute distress.   Mild overweight, looks well  HEENT: Atraumatic, Normocephalic.   Bilateral TM wnl, oropharynx normal.  PEERL,EOMI.   Ears and Nose: No external deformity. CV: RRR, No M/G/R. No JVD. No thrill. No extra heart sounds. PULM: CTA B, no wheezes, crackles, rhonchi. No retractions. No resp. distress. No accessory muscle use. ABD: S, NT, ND, +BS. No rebound. No HSM. EXTR: No c/c/e PSYCH: Normally interactive. Conversant.    Assessment and Plan: Night sweats - Plan: CBC, Comprehensive metabolic panel, TSH, QuantiFERON-TB Gold Plus, Sedimentation rate, HIV Antibody (routine testing w rflx)  Screening for tuberculosis - Plan: QuantiFERON-TB Gold Plus  Screening for deficiency anemia - Plan: CBC  Screening  for diabetes mellitus - Plan: Comprehensive metabolic panel  Visit today to discuss night sweats Labs eval as above to rule out HIV, TB, thyroid disorder Will plan further follow- up pending labs. Moderate medical decision making today  This visit occurred during the SARS-CoV-2 public health emergency.  Safety protocols were in place, including screening questions prior to the visit, additional usage of staff PPE, and extensive cleaning of exam room while observing appropriate contact time as indicated for disinfecting solutions.   Moderate medical decision making today Signed , MD  Received her labs as below, message to patient  Results for orders placed or performed in visit on 07/18/19  CBC  Result Value Ref Range   WBC 4.6 4.0 - 10.5 K/uL   RBC 4.96 3.87 - 5.11 Mil/uL   Platelets 230.0 150.0 - 400.0 K/uL   Hemoglobin 13.9 12.0 - 15.0 g/dL   HCT 09/17/19 61.9 - 50.9 %   MCV 84.0 78.0 - 100.0 fl   MCHC 33.4 30.0 - 36.0 g/dL   RDW 32.6 71.2 - 45.8 %  Comprehensive metabolic panel  Result Value Ref Range   Sodium 137 135 - 145 mEq/L   Potassium 4.2 3.5 - 5.1 mEq/L   Chloride 101 96 - 112 mEq/L   CO2 29 19 - 32 mEq/L   Glucose, Bld 74 70 - 99 mg/dL   BUN 10 6 - 23 mg/dL   Creatinine, Ser 09.9 0.40 - 1.20 mg/dL   Total Bilirubin 0.4 0.2 - 1.2 mg/dL   Alkaline Phosphatase 44 39 - 117 U/L   AST 17 0 - 37 U/L   ALT 12 0 - 35 U/L   Total Protein 7.6 6.0 - 8.3 g/dL   Albumin 4.5 3.5 - 5.2 g/dL   GFR 8.33 82.50 mL/min   Calcium 9.8 8.4 - 10.5 mg/dL  TSH  Result Value Ref Range   TSH 0.38 0.35 - 4.50 uIU/mL  Sedimentation rate  Result Value Ref Range   Sed Rate 16 0 - 20 mm/hr

## 2019-07-18 ENCOUNTER — Ambulatory Visit (INDEPENDENT_AMBULATORY_CARE_PROVIDER_SITE_OTHER): Payer: BC Managed Care – PPO | Admitting: Family Medicine

## 2019-07-18 ENCOUNTER — Encounter: Payer: Self-pay | Admitting: Family Medicine

## 2019-07-18 ENCOUNTER — Other Ambulatory Visit: Payer: Self-pay

## 2019-07-18 VITALS — BP 114/73 | HR 82 | Temp 97.9°F | Resp 17 | Ht 65.0 in | Wt 164.0 lb

## 2019-07-18 DIAGNOSIS — Z131 Encounter for screening for diabetes mellitus: Secondary | ICD-10-CM | POA: Diagnosis not present

## 2019-07-18 DIAGNOSIS — Z130 Encounter for screening for diseases of the blood and blood-forming organs and certain disorders involving the immune mechanism: Secondary | ICD-10-CM | POA: Diagnosis not present

## 2019-07-18 DIAGNOSIS — R61 Generalized hyperhidrosis: Secondary | ICD-10-CM | POA: Diagnosis not present

## 2019-07-18 DIAGNOSIS — Z111 Encounter for screening for respiratory tuberculosis: Secondary | ICD-10-CM | POA: Diagnosis not present

## 2019-07-18 LAB — CBC
HCT: 41.7 % (ref 36.0–46.0)
Hemoglobin: 13.9 g/dL (ref 12.0–15.0)
MCHC: 33.4 g/dL (ref 30.0–36.0)
MCV: 84 fl (ref 78.0–100.0)
Platelets: 230 10*3/uL (ref 150.0–400.0)
RBC: 4.96 Mil/uL (ref 3.87–5.11)
RDW: 14.5 % (ref 11.5–15.5)
WBC: 4.6 10*3/uL (ref 4.0–10.5)

## 2019-07-18 LAB — COMPREHENSIVE METABOLIC PANEL
ALT: 12 U/L (ref 0–35)
AST: 17 U/L (ref 0–37)
Albumin: 4.5 g/dL (ref 3.5–5.2)
Alkaline Phosphatase: 44 U/L (ref 39–117)
BUN: 10 mg/dL (ref 6–23)
CO2: 29 mEq/L (ref 19–32)
Calcium: 9.8 mg/dL (ref 8.4–10.5)
Chloride: 101 mEq/L (ref 96–112)
Creatinine, Ser: 0.73 mg/dL (ref 0.40–1.20)
GFR: 98.09 mL/min (ref >60.00)
Glucose, Bld: 74 mg/dL (ref 70–99)
Potassium: 4.2 mEq/L (ref 3.5–5.1)
Sodium: 137 mEq/L (ref 135–145)
Total Bilirubin: 0.4 mg/dL (ref 0.2–1.2)
Total Protein: 7.6 g/dL (ref 6.0–8.3)

## 2019-07-18 LAB — TSH: TSH: 0.38 u[IU]/mL (ref 0.35–4.50)

## 2019-07-18 LAB — SEDIMENTATION RATE: Sed Rate: 16 mm/hr (ref 0–20)

## 2019-07-18 NOTE — Patient Instructions (Signed)
It was good to see you again today!   I will be in touch with your labs asap  Please keep me posted about your nightsweats

## 2019-07-19 LAB — HIV ANTIBODY (ROUTINE TESTING W REFLEX): HIV 1&2 Ab, 4th Generation: NONREACTIVE

## 2019-07-20 LAB — QUANTIFERON-TB GOLD PLUS
Mitogen-NIL: >10 IU/mL
NIL: 0.02 IU/mL
QuantiFERON-TB Gold Plus: NEGATIVE
TB1-NIL: 0.02 IU/mL
TB2-NIL: 0.01 IU/mL

## 2019-09-06 ENCOUNTER — Encounter: Payer: Self-pay | Admitting: Family Medicine

## 2019-09-06 DIAGNOSIS — F32A Depression, unspecified: Secondary | ICD-10-CM

## 2019-09-06 MED ORDER — BUPROPION HCL ER (XL) 300 MG PO TB24: 300.0000 mg | ORAL_TABLET | Freq: Every day | ORAL | 3 refills | Status: DC

## 2019-09-06 NOTE — Addendum Note (Signed)
Addended by: Pearline Cables on: 09/06/2019 03:38 PM   Modules accepted: Orders

## 2019-11-29 ENCOUNTER — Encounter: Payer: Self-pay | Admitting: Family Medicine

## 2019-12-12 ENCOUNTER — Ambulatory Visit: Payer: BC Managed Care – PPO | Admitting: Family Medicine

## 2020-02-29 ENCOUNTER — Other Ambulatory Visit: Payer: Self-pay | Admitting: Family Medicine

## 2020-02-29 DIAGNOSIS — F32A Depression, unspecified: Secondary | ICD-10-CM

## 2020-06-12 ENCOUNTER — Other Ambulatory Visit: Payer: Self-pay | Admitting: Family Medicine

## 2020-06-12 DIAGNOSIS — F32A Depression, unspecified: Secondary | ICD-10-CM

## 2020-10-16 ENCOUNTER — Other Ambulatory Visit: Payer: Self-pay | Admitting: Family Medicine

## 2020-10-16 ENCOUNTER — Encounter: Payer: Self-pay | Admitting: Family Medicine

## 2020-10-16 DIAGNOSIS — F32A Depression, unspecified: Secondary | ICD-10-CM

## 2020-10-16 MED ORDER — BUPROPION HCL ER (XL) 300 MG PO TB24: 300.0000 mg | ORAL_TABLET | Freq: Every day | ORAL | 0 refills | Status: DC

## 2020-11-09 ENCOUNTER — Other Ambulatory Visit: Payer: Self-pay | Admitting: Family Medicine

## 2020-11-09 DIAGNOSIS — F32A Depression, unspecified: Secondary | ICD-10-CM

## 2020-11-24 ENCOUNTER — Encounter: Payer: Self-pay | Admitting: Family Medicine

## 2020-11-24 DIAGNOSIS — F32A Depression, unspecified: Secondary | ICD-10-CM

## 2020-11-24 MED ORDER — ESCITALOPRAM OXALATE 20 MG PO TABS: 20.0000 mg | ORAL_TABLET | Freq: Every day | ORAL | 0 refills | Status: DC

## 2020-11-24 MED ORDER — BUPROPION HCL ER (XL) 300 MG PO TB24: 300.0000 mg | ORAL_TABLET | Freq: Every day | ORAL | 0 refills | Status: DC

## 2020-12-09 ENCOUNTER — Encounter: Payer: Self-pay | Admitting: Family Medicine

## 2020-12-27 ENCOUNTER — Other Ambulatory Visit: Payer: Self-pay | Admitting: Family Medicine

## 2020-12-27 DIAGNOSIS — F32A Depression, unspecified: Secondary | ICD-10-CM

## 2021-01-11 ENCOUNTER — Other Ambulatory Visit: Payer: Self-pay

## 2021-01-11 ENCOUNTER — Telehealth (INDEPENDENT_AMBULATORY_CARE_PROVIDER_SITE_OTHER): Payer: BC Managed Care – PPO | Admitting: Family Medicine

## 2021-01-11 DIAGNOSIS — F32A Depression, unspecified: Secondary | ICD-10-CM

## 2021-01-11 MED ORDER — BUPROPION HCL ER (XL) 300 MG PO TB24: 300.0000 mg | ORAL_TABLET | Freq: Every day | ORAL | 3 refills | Status: DC

## 2021-01-11 MED ORDER — ESCITALOPRAM OXALATE 20 MG PO TABS: 20.0000 mg | ORAL_TABLET | Freq: Every day | ORAL | 3 refills | Status: DC
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
## 2021-06-10 ENCOUNTER — Other Ambulatory Visit: Payer: Self-pay | Admitting: Family Medicine

## 2021-06-10 DIAGNOSIS — F32A Depression, unspecified: Secondary | ICD-10-CM

## 2021-09-06 ENCOUNTER — Other Ambulatory Visit: Payer: Self-pay | Admitting: Family Medicine

## 2021-09-06 DIAGNOSIS — F32A Depression, unspecified: Secondary | ICD-10-CM

## 2021-10-05 NOTE — Progress Notes (Deleted)
[facility] at [facility] [address] [place], [place] [address] [phone]  Date:  10/07/2021   Name:  Donna Hodges   DOB:  1995-12-18   MRN:  [id number]  PCP:  Pearline Cables, MD    Chief Complaint: No chief complaint on file.   History of Present Illness:  Donna Hodges is a 26 y.o. very pleasant female patient who presents with the following:  Pt seen today for follow-up History of depression and allergies- last seen by myself virtually 9/22 At that time she was doing well with wellbutrin and lexapro   HPV sereis Hep C screening canbe done Pap is due  Overdue for labs today  Patient Active Problem List   Diagnosis Date Noted   Major depressive disorder, single episode, moderate degree (HCC) 10/07/2015   Major depressive disorder, single episode, moderate (HCC) 10/07/2015    Past Medical History:  Diagnosis Date   Allergy    Seasonal allergies   Anxiety    Depression     Past Surgical History:  Procedure Laterality Date   THERAPEUTIC ABORTION      Social History   Tobacco Use   Smoking status: Never   Smokeless tobacco: Never  Substance Use Topics   Alcohol use: No    Alcohol/week: 0.0 standard drinks of alcohol   Drug use: Yes    Frequency: 1.0 times per week    Types: Marijuana    Comment: Marijuana bowl two times a week for nausea     Family History  Problem Relation Age of Onset   Diabetes Mother    Cancer Father    Hyperlipidemia Father    Mental illness Sister    Mental illness Brother    Cancer Maternal Grandmother        liver cancer    Allergies  Allergen Reactions   Soy Allergy Other (See Comments)    "throat itches"     Medication list has been reviewed and updated.  Current Outpatient Medications on File Prior to Visit  Medication Sig Dispense Refill   buPROPion (WELLBUTRIN XL) 300 MG 24 hr tablet Take 1 tablet (300 mg total) by mouth daily. 90 tablet 3   escitalopram  (LEXAPRO) 20 MG tablet TAKE 1 TABLET BY MOUTH EVERY DAY 90 tablet 0   No current facility-administered medications on file prior to visit.    Review of Systems:  As per HPI- otherwise negative.   Physical Examination: There were no vitals filed for this visit. There were no vitals filed for this visit. There is no height or weight on file to calculate BMI. Ideal Body Weight:    GEN: no acute distress. HEENT: Atraumatic, Normocephalic.  Ears and Nose: No external deformity. CV: RRR, No M/G/R. No JVD. No thrill. No extra heart sounds. PULM: CTA B, no wheezes, crackles, rhonchi. No retractions. No resp. distress. No accessory muscle use. ABD: S, NT, ND, +BS. No rebound. No HSM. EXTR: No c/c/e PSYCH: Normally interactive. Conversant.    Assessment and Plan: ***  Signed Abbe Amsterdam, MD

## 2021-10-07 ENCOUNTER — Ambulatory Visit: Payer: BC Managed Care – PPO | Admitting: Family Medicine

## 2021-10-07 DIAGNOSIS — Z13220 Encounter for screening for lipoid disorders: Secondary | ICD-10-CM

## 2021-10-07 DIAGNOSIS — Z130 Encounter for screening for diseases of the blood and blood-forming organs and certain disorders involving the immune mechanism: Secondary | ICD-10-CM

## 2021-10-07 DIAGNOSIS — R5383 Other fatigue: Secondary | ICD-10-CM

## 2021-10-07 DIAGNOSIS — Z124 Encounter for screening for malignant neoplasm of cervix: Secondary | ICD-10-CM

## 2021-10-07 DIAGNOSIS — Z131 Encounter for screening for diabetes mellitus: Secondary | ICD-10-CM

## 2021-10-07 DIAGNOSIS — Z1329 Encounter for screening for other suspected endocrine disorder: Secondary | ICD-10-CM

## 2021-10-07 DIAGNOSIS — Z1159 Encounter for screening for other viral diseases: Secondary | ICD-10-CM

## 2022-01-18 ENCOUNTER — Ambulatory Visit
Admission: RE | Admit: 2022-01-18 | Discharge: 2022-01-18 | Disposition: A | Discharge status: Home / Self Care | Payer: BC Managed Care – PPO | Source: Ambulatory Visit | Attending: Internal Medicine | Admitting: Internal Medicine

## 2022-01-18 ENCOUNTER — Ambulatory Visit (INDEPENDENT_AMBULATORY_CARE_PROVIDER_SITE_OTHER): Payer: BC Managed Care – PPO

## 2022-01-18 VITALS — BP 115/62 | HR 90 | Temp 98.0°F | Resp 16

## 2022-01-18 DIAGNOSIS — B349 Viral infection, unspecified: Secondary | ICD-10-CM

## 2022-01-18 DIAGNOSIS — R0602 Shortness of breath: Secondary | ICD-10-CM

## 2022-01-18 MED ORDER — ALBUTEROL SULFATE HFA 108 (90 BASE) MCG/ACT IN AERS: 1.0000 | INHALATION_SPRAY | Freq: Four times a day (QID) | RESPIRATORY_TRACT | 0 refills | Status: DC | PRN

## 2022-01-18 NOTE — ED Provider Notes (Signed)
[facility]    CSN: [id number] Arrival date & time: 01/18/22  1046      History   Chief Complaint Chief Complaint  Patient presents with   Chills    Began feeling symptoms of sickness Sept. 27Heaviness in chest(feels like walking pneumonia, fatigue, clammy skin, diarrhea) - Entered by patient   heavy lungs    HPI Donna Hodges is a 26 y.o. female.   Patient presents after not feeling well since 01/08/2022.  Patient reports that symptoms started off with a scratchy throat and cough that is now pretty much resolved.  She is concerned today given that she has been feeling some intermittent shortness of breath with exertion and "chest heaviness".  Denies of chest pain or nasal congestion currently. Has persistent nonproductive cough.  Patient reports that she also started having some nausea without vomiting and diarrhea about 3 days ago.  Denies blood in stool.  Denies any known sick contacts.  Denies any associated fever.     Past Medical History:  Diagnosis Date   Allergy    Seasonal allergies   Anxiety    Depression     Patient Active Problem List   Diagnosis Date Noted   Major depressive disorder, single episode, moderate degree ([place]) 10/07/2015   Major depressive disorder, single episode, moderate ([place]) 10/07/2015    Past Surgical History:  Procedure Laterality Date   THERAPEUTIC ABORTION      OB History   No obstetric history on file.      Home Medications    Prior to Admission medications   Medication Sig Start Date End Date Taking? Authorizing Provider  albuterol (VENTOLIN HFA) 108 (90 Base) MCG/ACT inhaler Inhale 1-2 puffs into the lungs every 6 (six) hours as needed for wheezing or shortness of breath. 01/18/22  Yes Gurjit Loconte, Hildred Alamin E, FNP  buPROPion (WELLBUTRIN XL) 300 MG 24 hr tablet Take 1 tablet (300 mg total) by mouth daily. 01/11/21   Copland, Gay Filler, MD  escitalopram (LEXAPRO) 20 MG tablet TAKE 1 TABLET BY MOUTH EVERY DAY 09/06/21   Copland,  Gay Filler, MD    Family History Family History  Problem Relation Age of Onset   Diabetes Mother    Cancer Father    Hyperlipidemia Father    Mental illness Sister    Mental illness Brother    Cancer Maternal Grandmother        liver cancer    Social History Social History   Tobacco Use   Smoking status: Never   Smokeless tobacco: Never  Substance Use Topics   Alcohol use: No    Alcohol/week: 0.0 standard drinks of alcohol   Drug use: Yes    Frequency: 1.0 times per week    Types: Marijuana    Comment: Marijuana bowl two times a week for nausea      Allergies   Soy allergy   Review of Systems Review of Systems Per HPI  Physical Exam Triage Vital Signs ED Triage Vitals [01/18/22 1059]  Enc Vitals Group     BP 115/62     Pulse Rate 90     Resp 16     Temp 98 F (36.7 C)     Temp Source Oral     SpO2 98 %     Weight      Height      Head Circumference      Peak Flow      Pain Score 3     Pain Loc  Pain Edu?      Excl. in GC?    No data found.  Updated Vital Signs BP 115/62 (BP Location: Left Arm)   Pulse 90   Temp 98 F (36.7 C) (Oral)   Resp 16   SpO2 98%   Visual Acuity Right Eye Distance:   Left Eye Distance:   Bilateral Distance:    Right Eye Near:   Left Eye Near:    Bilateral Near:     Physical Exam Constitutional:      General: She is not in acute distress.    Appearance: Normal appearance. She is not toxic-appearing or diaphoretic.  HENT:     Head: Normocephalic and atraumatic.     Right Ear: Tympanic membrane and ear canal normal.     Left Ear: Tympanic membrane and ear canal normal.     Nose: Congestion present.     Mouth/Throat:     Mouth: Mucous membranes are moist.     Pharynx: No posterior oropharyngeal erythema.  Eyes:     Extraocular Movements: Extraocular movements intact.     Conjunctiva/sclera: Conjunctivae normal.     Pupils: Pupils are equal, round, and reactive to light.  Cardiovascular:     Rate and  Rhythm: Normal rate and regular rhythm.     Pulses: Normal pulses.     Heart sounds: Normal heart sounds.  Pulmonary:     Effort: Pulmonary effort is normal. No respiratory distress.     Breath sounds: Normal breath sounds. No stridor. No wheezing, rhonchi or rales.  Abdominal:     General: Abdomen is flat. Bowel sounds are normal. There is no distension.     Palpations: Abdomen is soft.     Tenderness: There is no abdominal tenderness.  Musculoskeletal:        General: Normal range of motion.     Cervical back: Normal range of motion.  Skin:    General: Skin is warm and dry.  Neurological:     General: No focal deficit present.     Mental Status: She is alert and oriented to person, place, and time. Mental status is at baseline.  Psychiatric:        Mood and Affect: Mood normal.        Behavior: Behavior normal.      UC Treatments / Results  Labs (all labs ordered are listed, but only abnormal results are displayed) Labs Reviewed - No data to display  EKG   Radiology DG Chest 2 View  Result Date: 01/18/2022 CLINICAL DATA:  Shortness of breath EXAM: CHEST - 2 VIEW COMPARISON:  None Available. FINDINGS: The heart size and mediastinal contours are within normal limits. Both lungs are clear. The visualized skeletal structures are unremarkable. IMPRESSION: No acute cardiopulmonary abnormality. Electronically Signed   By: Jacob Moores M.D.   On: 01/18/2022 11:21    Procedures Procedures (including critical care time)  Medications Ordered in UC Medications - No data to display  Initial Impression / Assessment and Plan / UC Course  I have reviewed the triage vital signs and the nursing notes.  Pertinent labs & imaging results that were available during my care of the patient were reviewed by me and considered in my medical decision making (see chart for details).     Chest x-ray was negative for any acute cardiopulmonary process.  Suspect patient's intermittent  shortness of breath is due to inflammation in the chest and I do think patient would benefit from albuterol inhaler.  This was prescribed for patient.  Education provided on administration of medication.  Do not suspect any cardiac etiology so EKG was deferred.  Suspect that patient's symptoms are related to persistent viral illness.  Suspicious that patient's new onset nausea and diarrhea could be related to new viral illness or persistent viral symptoms.  No signs of acute abdomen on exam or need for emergent evaluation or imaging of the abdomen.  Advised patient to ensure adequate fluid hydration and bland diet for diarrhea.  No current signs of dehydration on exam at this time.  Patient offered nausea medication but declined.  Patient advised to follow-up if any symptoms persist or worsen and was given strict ER precautions.  Patient verbalized understanding and was agreeable with plan. Final Clinical Impressions(s) / UC Diagnoses   Final diagnoses:  Shortness of breath  Viral illness     Discharge Instructions      I have prescribed you an albuterol inhaler as we discussed.  Please follow-up if symptoms persist or worsen.     ED Prescriptions     Medication Sig Dispense Auth. Provider   albuterol (VENTOLIN HFA) 108 (90 Base) MCG/ACT inhaler Inhale 1-2 puffs into the lungs every 6 (six) hours as needed for wheezing or shortness of breath. 1 each Gustavus Bryant, [place]      PDMP not reviewed this encounter.   Gustavus Bryant, [place] 01/18/22 1141

## 2022-01-18 NOTE — ED Triage Notes (Signed)
Pt c/o feeling sick since 01/08/22. States she is feeling much better but lungs feel "heavy" and hands and feet are sweaty.

## 2022-01-18 NOTE — Discharge Instructions (Signed)
I have prescribed you an albuterol inhaler as we discussed.  Please follow-up if symptoms persist or worsen.

## 2022-01-20 ENCOUNTER — Other Ambulatory Visit: Payer: Self-pay | Admitting: Family Medicine

## 2022-01-20 DIAGNOSIS — F32A Depression, unspecified: Secondary | ICD-10-CM

## 2022-01-25 ENCOUNTER — Other Ambulatory Visit: Payer: Self-pay | Admitting: Family Medicine

## 2022-01-25 DIAGNOSIS — F32A Depression, unspecified: Secondary | ICD-10-CM

## 2022-03-19 ENCOUNTER — Emergency Department (HOSPITAL_COMMUNITY)
Admission: EM | Admit: 2022-03-19 | Discharge: 2022-03-19 | Discharge status: Against Medical Advice | Payer: BC Managed Care – PPO | Attending: Student | Admitting: Student

## 2022-03-19 ENCOUNTER — Other Ambulatory Visit: Payer: Self-pay

## 2022-03-19 DIAGNOSIS — R197 Diarrhea, unspecified: Secondary | ICD-10-CM | POA: Diagnosis not present

## 2022-03-19 DIAGNOSIS — Z1152 Encounter for screening for COVID-19: Secondary | ICD-10-CM | POA: Insufficient documentation

## 2022-03-19 DIAGNOSIS — R112 Nausea with vomiting, unspecified: Secondary | ICD-10-CM | POA: Diagnosis not present

## 2022-03-19 DIAGNOSIS — Z5321 Procedure and treatment not carried out due to patient leaving prior to being seen by health care provider: Secondary | ICD-10-CM | POA: Diagnosis not present

## 2022-03-19 DIAGNOSIS — R109 Unspecified abdominal pain: Secondary | ICD-10-CM | POA: Diagnosis not present

## 2022-03-19 LAB — COMPREHENSIVE METABOLIC PANEL
ALT: 15 U/L (ref 0–44)
AST: 20 U/L (ref 15–41)
Albumin: 4.3 g/dL (ref 3.5–5.0)
Alkaline Phosphatase: 40 U/L (ref 38–126)
Anion gap: 11 (ref 5–15)
BUN: 16 mg/dL (ref 6–20)
CO2: 20 mmol/L — ABNORMAL LOW (ref 22–32)
Calcium: 9.6 mg/dL (ref 8.9–10.3)
Chloride: 105 mmol/L (ref 98–111)
Creatinine, Ser: 0.57 mg/dL (ref 0.44–1.00)
GFR, Estimated: >60 mL/min (ref >60)
Glucose, Bld: 100 mg/dL — ABNORMAL HIGH (ref 70–99)
Potassium: 4 mmol/L (ref 3.5–5.1)
Sodium: 136 mmol/L (ref 135–145)
Total Bilirubin: 0.3 mg/dL (ref 0.3–1.2)
Total Protein: 8.2 g/dL — ABNORMAL HIGH (ref 6.5–8.1)

## 2022-03-19 LAB — RESP PANEL BY RT-PCR (FLU A&B, COVID) ARPGX2
Influenza A by PCR: NEGATIVE
Influenza B by PCR: NEGATIVE
SARS Coronavirus 2 by RT PCR: NEGATIVE

## 2022-03-19 LAB — HCG, QUANTITATIVE, PREGNANCY: hCG, Beta Chain, Quant, S: <1 m[IU]/mL (ref <5)

## 2022-03-19 LAB — CBC WITH DIFFERENTIAL/PLATELET
Abs Immature Granulocytes: 0.03 10*3/uL (ref 0.00–0.07)
Basophils Absolute: 0.1 10*3/uL (ref 0.0–0.1)
Basophils Relative: 1 %
Eosinophils Absolute: 0 10*3/uL (ref 0.0–0.5)
Eosinophils Relative: 1 %
HCT: 41.7 % (ref 36.0–46.0)
Hemoglobin: 13.7 g/dL (ref 12.0–15.0)
Immature Granulocytes: 0 %
Lymphocytes Relative: 14 %
Lymphs Abs: 1.2 10*3/uL (ref 0.7–4.0)
MCH: 27.6 pg (ref 26.0–34.0)
MCHC: 32.9 g/dL (ref 30.0–36.0)
MCV: 84.1 fL (ref 80.0–100.0)
Monocytes Absolute: 0.5 10*3/uL (ref 0.1–1.0)
Monocytes Relative: 5 %
Neutro Abs: 6.8 10*3/uL (ref 1.7–7.7)
Neutrophils Relative %: 79 %
Platelets: 237 10*3/uL (ref 150–400)
RBC: 4.96 MIL/uL (ref 3.87–5.11)
RDW: 12.7 % (ref 11.5–15.5)
WBC: 8.6 10*3/uL (ref 4.0–10.5)
nRBC: 0 % (ref 0.0–0.2)

## 2022-03-19 LAB — LIPASE, BLOOD: Lipase: 44 U/L (ref 11–51)

## 2022-03-19 NOTE — ED Triage Notes (Signed)
Pt reports nausea/vomiting/diarrhea and abdominal pain x 2 weeks. Denies fevers.
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
## 2022-03-24 NOTE — Progress Notes (Unsigned)
[facility] at [facility] [address], [address] [place], [place] [address] [phone]  Date:  03/26/2022   Name:  Donna Hodges   DOB:  Feb 17, 1996   MRN:  [id number]  PCP:  Pearline Cables, MD    Chief Complaint: No chief complaint on file.   History of Present Illness:  Donna Hodges is a 26 y.o. very pleasant female patient who presents with the following:  Pt with history of depression and allergies, here today with a stomach concern Last visit with myself was a virtual visit in September  She was in the ER last week with vomiting, had some labs at that time - CMP CBC  Patient Active Problem List   Diagnosis Date Noted   Major depressive disorder, single episode, moderate degree (HCC) 10/07/2015   Major depressive disorder, single episode, moderate (HCC) 10/07/2015    Past Medical History:  Diagnosis Date   Allergy    Seasonal allergies   Anxiety    Depression     Past Surgical History:  Procedure Laterality Date   THERAPEUTIC ABORTION      Social History   Tobacco Use   Smoking status: Never   Smokeless tobacco: Never  Substance Use Topics   Alcohol use: No    Alcohol/week: 0.0 standard drinks of alcohol   Drug use: Yes    Frequency: 1.0 times per week    Types: Marijuana    Comment: Marijuana bowl two times a week for nausea     Family History  Problem Relation Age of Onset   Diabetes Mother    Cancer Father    Hyperlipidemia Father    Mental illness Sister    Mental illness Brother    Cancer Maternal Grandmother        liver cancer    Allergies  Allergen Reactions   Soy Allergy Other (See Comments)    "throat itches"     Medication list has been reviewed and updated.  Current Outpatient Medications on File Prior to Visit  Medication Sig Dispense Refill   albuterol (VENTOLIN HFA) 108 (90 Base) MCG/ACT inhaler Inhale 1-2 puffs into the lungs every 6 (six) hours as needed for wheezing or shortness  of breath. 1 each 0   buPROPion (WELLBUTRIN XL) 300 MG 24 hr tablet Take 1 tablet (300 mg total) by mouth daily. Need another appointment for additional refills 90 tablet 0   escitalopram (LEXAPRO) 20 MG tablet TAKE 1 TABLET BY MOUTH EVERY DAY 90 tablet 0   No current facility-administered medications on file prior to visit.    Review of Systems:  As per HPI- otherwise negative.   Physical Examination: There were no vitals filed for this visit. There were no vitals filed for this visit. There is no height or weight on file to calculate BMI. Ideal Body Weight:    GEN: no acute distress. HEENT: Atraumatic, Normocephalic.  Ears and Nose: No external deformity. CV: RRR, No M/G/R. No JVD. No thrill. No extra heart sounds. PULM: CTA B, no wheezes, crackles, rhonchi. No retractions. No resp. distress. No accessory muscle use. ABD: S, NT, ND, +BS. No rebound. No HSM. EXTR: No c/c/e PSYCH: Normally interactive. Conversant.    Assessment and Plan: ***  Signed Abbe Amsterdam, MD

## 2022-03-24 NOTE — Patient Instructions (Incomplete)
Good to see you today - I am sorry you are not feeling great!   Please stop by imaging on the ground floor to set up a gallbladder ultrasound for tomorrow If you get worse in the meantime- back to the ER!   Avoid fatty foods for now

## 2022-03-26 ENCOUNTER — Ambulatory Visit: Payer: BC Managed Care – PPO | Admitting: Family Medicine

## 2022-03-26 VITALS — BP 100/60 | HR 86 | Temp 97.9°F | Resp 18 | Ht 65.0 in | Wt 196.6 lb

## 2022-03-26 DIAGNOSIS — R1011 Right upper quadrant pain: Secondary | ICD-10-CM

## 2022-03-27 ENCOUNTER — Ambulatory Visit (HOSPITAL_BASED_OUTPATIENT_CLINIC_OR_DEPARTMENT_OTHER)
Admission: RE | Admit: 2022-03-27 | Discharge: 2022-03-27 | Disposition: A | Discharge status: Home / Self Care | Payer: BC Managed Care – PPO | Source: Ambulatory Visit | Attending: Family Medicine | Admitting: Family Medicine

## 2022-03-27 ENCOUNTER — Encounter: Payer: Self-pay | Admitting: Family Medicine

## 2022-03-27 DIAGNOSIS — R198 Other specified symptoms and signs involving the digestive system and abdomen: Secondary | ICD-10-CM

## 2022-03-27 DIAGNOSIS — R1084 Generalized abdominal pain: Secondary | ICD-10-CM

## 2022-03-27 DIAGNOSIS — R1011 Right upper quadrant pain: Secondary | ICD-10-CM | POA: Diagnosis not present

## 2022-03-27 DIAGNOSIS — R10811 Right upper quadrant abdominal tenderness: Secondary | ICD-10-CM | POA: Diagnosis not present

## 2022-03-31 ENCOUNTER — Ambulatory Visit: Payer: BC Managed Care – PPO | Admitting: Family Medicine

## 2022-04-15 ENCOUNTER — Other Ambulatory Visit: Payer: Self-pay | Admitting: Family Medicine

## 2022-04-15 DIAGNOSIS — F32A Depression, unspecified: Secondary | ICD-10-CM

## 2022-04-16 NOTE — Addendum Note (Signed)
Addended by: Lamar Blinks C on: 04/16/2022 12:25 PM   Modules accepted: Orders

## 2022-04-18 ENCOUNTER — Ambulatory Visit: Payer: BC Managed Care – PPO

## 2022-04-18 MED ORDER — IOHEXOL 300 MG/ML  SOLN: 100.0000 mL | Freq: Once | INTRAMUSCULAR | Status: DC | PRN

## 2022-04-24 ENCOUNTER — Encounter: Payer: Self-pay | Admitting: Family Medicine

## 2022-04-26 ENCOUNTER — Encounter: Payer: Self-pay | Admitting: Family Medicine

## 2022-04-26 ENCOUNTER — Ambulatory Visit (HOSPITAL_BASED_OUTPATIENT_CLINIC_OR_DEPARTMENT_OTHER)
Admission: RE | Admit: 2022-04-26 | Discharge: 2022-04-26 | Disposition: A | Discharge status: Home / Self Care | Payer: BC Managed Care – PPO | Source: Ambulatory Visit | Attending: Family Medicine | Admitting: Family Medicine

## 2022-04-26 DIAGNOSIS — R1084 Generalized abdominal pain: Secondary | ICD-10-CM | POA: Insufficient documentation

## 2022-04-26 DIAGNOSIS — N289 Disorder of kidney and ureter, unspecified: Secondary | ICD-10-CM | POA: Diagnosis not present

## 2022-04-26 DIAGNOSIS — R9389 Abnormal findings on diagnostic imaging of other specified body structures: Secondary | ICD-10-CM

## 2022-04-26 DIAGNOSIS — R1031 Right lower quadrant pain: Secondary | ICD-10-CM | POA: Diagnosis not present

## 2022-04-26 MED ORDER — IOHEXOL 300 MG/ML  SOLN
100.0000 mL | Freq: Once | INTRAMUSCULAR | Status: AC | PRN
  Administered 2022-04-26: 80 mL via INTRAVENOUS

## 2022-05-07 ENCOUNTER — Telehealth (HOSPITAL_BASED_OUTPATIENT_CLINIC_OR_DEPARTMENT_OTHER): Payer: Self-pay

## 2022-05-09 ENCOUNTER — Encounter: Payer: Self-pay | Admitting: Gastroenterology

## 2022-05-13 ENCOUNTER — Encounter: Payer: Self-pay | Admitting: Family Medicine

## 2022-05-13 ENCOUNTER — Ambulatory Visit (HOSPITAL_BASED_OUTPATIENT_CLINIC_OR_DEPARTMENT_OTHER)
Admission: RE | Admit: 2022-05-13 | Discharge: 2022-05-13 | Disposition: A | Discharge status: Home / Self Care | Payer: BC Managed Care – PPO | Source: Ambulatory Visit | Attending: Family Medicine | Admitting: Family Medicine

## 2022-05-13 DIAGNOSIS — N83292 Other ovarian cyst, left side: Secondary | ICD-10-CM | POA: Diagnosis not present

## 2022-05-13 DIAGNOSIS — R9389 Abnormal findings on diagnostic imaging of other specified body structures: Secondary | ICD-10-CM | POA: Insufficient documentation

## 2022-05-30 ENCOUNTER — Encounter: Payer: Self-pay | Admitting: Gastroenterology

## 2022-05-30 ENCOUNTER — Other Ambulatory Visit (INDEPENDENT_AMBULATORY_CARE_PROVIDER_SITE_OTHER): Payer: BC Managed Care – PPO

## 2022-05-30 ENCOUNTER — Ambulatory Visit: Payer: BC Managed Care – PPO | Admitting: Gastroenterology

## 2022-05-30 VITALS — BP 80/60 | HR 80 | Ht 65.5 in | Wt 193.4 lb

## 2022-05-30 DIAGNOSIS — K6289 Other specified diseases of anus and rectum: Secondary | ICD-10-CM | POA: Insufficient documentation

## 2022-05-30 DIAGNOSIS — R194 Change in bowel habit: Secondary | ICD-10-CM

## 2022-05-30 DIAGNOSIS — K625 Hemorrhage of anus and rectum: Secondary | ICD-10-CM | POA: Diagnosis not present

## 2022-05-30 DIAGNOSIS — R1084 Generalized abdominal pain: Secondary | ICD-10-CM

## 2022-05-30 LAB — TSH: TSH: 0.68 u[IU]/mL (ref 0.35–5.50)

## 2022-05-30 LAB — C-REACTIVE PROTEIN: CRP: <1 mg/dL (ref 0.5–20.0)

## 2022-05-30 LAB — SEDIMENTATION RATE: Sed Rate: 27 mm/hr — ABNORMAL HIGH (ref 0–20)

## 2022-05-30 MED ORDER — NA SULFATE-K SULFATE-MG SULF 17.5-3.13-1.6 GM/177ML PO SOLN: 1.0000 | Freq: Once | ORAL | 0 refills | Status: AC

## 2022-05-30 NOTE — Patient Instructions (Signed)
Your provider has requested that you go to the basement level for lab work before leaving today. Press "B" on the elevator. The lab is located at the first door on the left as you exit the elevator.  You have been scheduled for a colonoscopy. Please follow written instructions given to you at your visit today.  Please pick up your prep supplies at the pharmacy within the next 1-3 days. If you use inhalers (even only as needed), please bring them with you on the day of your procedure.  Due to recent changes in healthcare laws, you may see the results of your imaging and laboratory studies on MyChart before your provider has had a chance to review them.  We understand that in some cases there may be results that are confusing or concerning to you. Not all laboratory results come back in the same time frame and the provider may be waiting for multiple results in order to interpret others.  Please give [place] 48 hours in order for your provider to thoroughly review all the results before contacting the office for clarification of your results.    It was a pleasure to see you today!  Thank you for trusting me with your gastrointestinal care!

## 2022-05-30 NOTE — Progress Notes (Signed)
05/30/2022 Nimsi Filley [id number] 11-Apr-1996   HISTORY OF PRESENT ILLNESS: This is a 27 year old female who is new to our office.  She had a GI referral placed from her PCP in 2018, but never came.  On this occasion she is here as a self-referral.  She says that she has had tummy issues all of her life.  Says over the past 5 months things have been worse.  She reports generalized abdominal pain typically worse after eating pretty much anything.  She says that her bowel movements have changed.  She says that they alternate between constipation and diarrhea, but more so diarrhea.  On bad days she will have about 5-7 bowel movements a day.  She has some rectal pain and sees red blood in her stool and mucus.  She says that she has some nausea, occasional vomiting.  Minimal issues with heartburn and reflux.  Recently had a CT scan of the abdomen and pelvis with contrast and right upper quadrant abdominal ultrasound that were unremarkable.  CBC, CMP, lipase okay.   Past Medical History:  Diagnosis Date   Allergy    Seasonal allergies   Anxiety    Chronic headaches    Depression    Past Surgical History:  Procedure Laterality Date   THERAPEUTIC ABORTION      reports that she has never smoked. She has never used smokeless tobacco. She reports current drug use. Frequency: 1.00 time per week. Drug: Marijuana. She reports that she does not drink alcohol. family history includes Cancer in her paternal grandfather; Colon polyps in her father; Diabetes in her father, maternal grandmother, and mother; Hyperlipidemia in her father; Liver cancer in her maternal grandmother; Mental illness in her brother and sister. Allergies  Allergen Reactions   Soy Allergy Other (See Comments)    "throat itches"       Outpatient Encounter Medications as of 05/30/2022  Medication Sig   buPROPion (WELLBUTRIN XL) 300 MG 24 hr tablet Take 1 tablet (300 mg total) by mouth daily. Need another appointment for additional  refills   escitalopram (LEXAPRO) 20 MG tablet TAKE 1 TABLET BY MOUTH EVERY DAY   No facility-administered encounter medications on file as of 05/30/2022.     REVIEW OF SYSTEMS  : All other systems reviewed and negative except where noted in the History of Present Illness.   PHYSICAL EXAM: BP (!) 80/60 (BP Location: Left Arm, Patient Position: Sitting, Cuff Size: Large)   Pulse 80   Ht 5' 5.5" (1.664 m) Comment: height measured without shoes  Wt 193 lb 6 oz (87.7 kg)   LMP 05/23/2022   BMI 31.69 kg/m  General: Well developed female in no acute distress Head: Normocephalic and atraumatic Eyes:  Sclerae anicteric, conjunctiva pink. Ears: Normal auditory acuity Lungs: Clear throughout to auscultation; no W/R/R. Heart: Regular rate and rhythm; no M/R/G. Abdomen: Soft, non-distended.  BS present.  Non-tender. Rectal:  No external abnormalities noted.  DRE revealed some soft stool in the rectal vault, no masses.  Light brown stool on the exam glove. Musculoskeletal: Symmetrical with no gross deformities  Skin: No lesions on visible extremities Extremities: No edema  Neurological: Alert oriented x 4, grossly non-focal Psychological:  Alert and cooperative. Normal mood and affect  ASSESSMENT AND PLAN: *27 year old female with complaints of longstanding GI issues of generalized abdominal pain, alternating bowel habits with constipation and diarrhea, but more predominantly diarrhea, rectal pain, rectal bleeding, mucus in her stool.  CT scan and ultrasound okay.  Will plan for colonoscopy with Dr. Silverio Decamp.  Will also check a TSH, sed rate, CRP, and celiac labs.  The risks, benefits, and alternatives to colonoscopy were discussed with the patient and she consents to proceed.  Possibly IBS related to her anxiety, but rule out IBD, etc.  CC:  Copland, Gay Filler, MD

## 2022-05-31 LAB — TISSUE TRANSGLUTAMINASE ABS,IGG,IGA
(tTG) Ab, IgA: <1 U/mL
(tTG) Ab, IgG: <1 U/mL

## 2022-05-31 LAB — IGA: Immunoglobulin A: 248 mg/dL (ref 47–310)

## 2022-07-12 ENCOUNTER — Other Ambulatory Visit: Payer: Self-pay | Admitting: Family Medicine

## 2022-07-12 DIAGNOSIS — F32A Depression, unspecified: Secondary | ICD-10-CM

## 2022-07-22 ENCOUNTER — Encounter: Payer: BC Managed Care – PPO | Admitting: Gastroenterology

## 2022-07-29 NOTE — Progress Notes (Signed)
Reviewed and agree with documentation and assessment and plan. K. Veena Yaremi Stahlman , MD   

## 2022-07-30 ENCOUNTER — Telehealth: Payer: Self-pay | Admitting: *Deleted

## 2022-07-30 NOTE — Telephone Encounter (Signed)
First attempt to reach pt for pre-visit and it went stright to VM. LM with contact # for call back. Will attempt to reach pt in 10 min.  attempt done after 10 min and unable to reach pt for pre-visit. Gave call back # to pt on VM and instructed her to contact office by end of day to reschedule her pre-visit or procedure will be canceled per protocol.

## 2022-08-15 ENCOUNTER — Encounter: Payer: BC Managed Care – PPO | Admitting: Gastroenterology

## 2022-08-18 ENCOUNTER — Other Ambulatory Visit: Payer: Self-pay | Admitting: Family Medicine

## 2022-08-18 DIAGNOSIS — F32A Depression, unspecified: Secondary | ICD-10-CM

## 2022-09-12 ENCOUNTER — Other Ambulatory Visit: Payer: Self-pay | Admitting: Family Medicine

## 2022-09-12 DIAGNOSIS — F32A Depression, unspecified: Secondary | ICD-10-CM

## 2022-09-12 MED ORDER — ESCITALOPRAM OXALATE 20 MG PO TABS: 20.0000 mg | ORAL_TABLET | Freq: Every day | ORAL | 0 refills | Status: DC

## 2022-09-21 NOTE — Progress Notes (Deleted)
[facility] [address] [place], [place] [address] [phone]  Date:  09/25/2022   Name:  Donna Hodges   DOB:  [birth year]   MRN:  [id number]  PCP:  Pearline Cables, MD    Chief Complaint: No chief complaint on file.   History of Present Illness:  Donna Hodges is a 27 y.o. very pleasant female patient who presents with the following:  Patient seen today for prescription follow-up Most recent visit with myself was in December at which time she had abdominal pain We ended up getting a CT abdomen pelvis which was normal except for heterogenous endometrial stripe, may be related to normal menstrual cycle Follow-up ultrasound was normal  She followed up with gastroenterology in February-abdominal pain especially after eating, bowel habit change, rectal bleeding and rectal pain It looks like she was to have a colonoscopy about do not think this happened as of yet  Labs were updated in December  Current medications bupropion XL 300 mg daily, Lexapro 20 mg daily  Pap smear may be due for update Patient Active Problem List   Diagnosis Date Noted   Rectal bleeding 05/30/2022   Rectal pain 05/30/2022   Bowel habit changes 05/30/2022   Generalized abdominal pain 05/30/2022   Major depressive disorder, single episode, moderate degree (HCC) 10/07/2015   Major depressive disorder, single episode, moderate (HCC) 10/07/2015    Past Medical History:  Diagnosis Date   Allergy    Seasonal allergies   Anxiety    Chronic headaches    Depression     Past Surgical History:  Procedure Laterality Date   THERAPEUTIC ABORTION      Social History   Tobacco Use   Smoking status: Never   Smokeless tobacco: Never  Vaping Use   Vaping Use: Never used  Substance Use Topics   Alcohol use: No    Alcohol/week: 0.0 standard drinks of alcohol   Drug use: Yes    Frequency: 1.0 times per week    Types: Marijuana    Comment:  Marijuana bowl two times a week for nausea     Family History  Problem Relation Age of Onset   Diabetes Mother    Hyperlipidemia Father    Colon polyps Father    Diabetes Father    Mental illness Sister    Mental illness Brother    Liver cancer Maternal Grandmother    Diabetes Maternal Grandmother    Cancer Paternal Grandfather        type unkown    Allergies  Allergen Reactions   Soy Allergy Other (See Comments)    "throat itches"     Medication list has been reviewed and updated.  Current Outpatient Medications on File Prior to Visit  Medication Sig Dispense Refill   buPROPion (WELLBUTRIN XL) 300 MG 24 hr tablet TAKE 1 TABLET (300 MG TOTAL) BY MOUTH DAILY. NEED ANOTHER APPOINTMENT FOR ADDITIONAL REFILLS 30 tablet 0   escitalopram (LEXAPRO) 20 MG tablet Take 1 tablet (20 mg total) by mouth daily. NEEDS APPT FOR REFILLS 30 tablet 0   No current facility-administered medications on file prior to visit.    Review of Systems:  As per HPI- otherwise negative.   Physical Examination: There were no vitals filed for this visit. There were no vitals filed for this visit. There is no height or weight on file to calculate BMI. Ideal Body Weight:    GEN: no acute distress.  HEENT: Atraumatic, Normocephalic.  Ears and Nose: No external deformity. CV: RRR, No M/G/R. No JVD. No thrill. No extra heart sounds. PULM: CTA B, no wheezes, crackles, rhonchi. No retractions. No resp. distress. No accessory muscle use. ABD: S, NT, ND, +BS. No rebound. No HSM. EXTR: No c/c/e PSYCH: Normally interactive. Conversant.    Assessment and Plan: ***  Signed Abbe Amsterdam, MD

## 2022-09-25 ENCOUNTER — Ambulatory Visit: Payer: BC Managed Care – PPO | Admitting: Family Medicine

## 2022-10-23 ENCOUNTER — Other Ambulatory Visit: Payer: Self-pay | Admitting: Family Medicine

## 2022-10-23 DIAGNOSIS — F32A Depression, unspecified: Secondary | ICD-10-CM

## 2022-11-01 NOTE — Progress Notes (Deleted)
Donna Hodges [address] [place], [place] [address] [phone]  Date:  11/06/2022   Name:  Montia Haslip   DOB:  02/25/1996   MRN:  [id number]  PCP:  Pearline Cables, MD    Chief Complaint: No chief complaint on file.   History of Present Illness:  Jeff Mccallum is a 27 y.o. very pleasant female patient who presents with the following:  Pt seen today to discuss medication- most recent visit with myself was in December with GI upset Pap Labs could be updated    Patient Active Problem List   Diagnosis Date Noted   Rectal bleeding 05/30/2022   Rectal pain 05/30/2022   Bowel habit changes 05/30/2022   Generalized abdominal pain 05/30/2022   Major depressive disorder, single episode, moderate degree (HCC) 10/07/2015   Major depressive disorder, single episode, moderate (HCC) 10/07/2015    Past Medical History:  Diagnosis Date   Allergy    Seasonal allergies   Anxiety    Chronic headaches    Depression     Past Surgical History:  Procedure Laterality Date   THERAPEUTIC ABORTION      Social History   Tobacco Use   Smoking status: Never   Smokeless tobacco: Never  Vaping Use   Vaping status: Never Used  Substance Use Topics   Alcohol use: No    Alcohol/week: 0.0 standard drinks of alcohol   Drug use: Yes    Frequency: 1.0 times per week    Types: Marijuana    Comment: Marijuana bowl two times a week for nausea     Family History  Problem Relation Age of Onset   Diabetes Mother    Hyperlipidemia Father    Colon polyps Father    Diabetes Father    Mental illness Sister    Mental illness Brother    Liver cancer Maternal Grandmother    Diabetes Maternal Grandmother    Cancer Paternal Grandfather        type unkown    Allergies  Allergen Reactions   Soy Allergy Other (See Comments)    "throat itches"     Medication list has been reviewed and updated.  Current Outpatient Medications on  File Prior to Visit  Medication Sig Dispense Refill   buPROPion (WELLBUTRIN XL) 300 MG 24 hr tablet TAKE 1 TABLET (300 MG TOTAL) BY MOUTH DAILY. NEED ANOTHER APPOINTMENT FOR ADDITIONAL REFILLS 30 tablet 0   escitalopram (LEXAPRO) 20 MG tablet Take 1 tablet (20 mg total) by mouth daily. 30 tablet 0   No current facility-administered medications on file prior to visit.    Review of Systems:  As per HPI- otherwise negative.   Physical Examination: There were no vitals filed for this visit. There were no vitals filed for this visit. There is no height or weight on file to calculate BMI. Ideal Body Weight:    GEN: no acute distress. HEENT: Atraumatic, Normocephalic.  Ears and Nose: No external deformity. CV: RRR, No M/G/R. No JVD. No thrill. No extra heart sounds. PULM: CTA B, no wheezes, crackles, rhonchi. No retractions. No resp. distress. No accessory muscle use. ABD: S, NT, ND, +BS. No rebound. No HSM. EXTR: No c/c/e PSYCH: Normally interactive. Conversant.    Assessment and Plan: ***  Signed Abbe Amsterdam, MD

## 2022-11-06 ENCOUNTER — Ambulatory Visit: Payer: BC Managed Care – PPO | Admitting: Family Medicine

## 2022-11-06 ENCOUNTER — Other Ambulatory Visit: Payer: Self-pay | Admitting: Family Medicine

## 2022-11-06 DIAGNOSIS — F32A Depression, unspecified: Secondary | ICD-10-CM

## 2022-11-14 ENCOUNTER — Other Ambulatory Visit: Payer: Self-pay | Admitting: Family Medicine

## 2022-11-14 DIAGNOSIS — F32A Depression, unspecified: Secondary | ICD-10-CM

## 2022-11-22 ENCOUNTER — Other Ambulatory Visit: Payer: Self-pay | Admitting: Family Medicine

## 2022-11-22 DIAGNOSIS — F32A Depression, unspecified: Secondary | ICD-10-CM

## 2022-12-01 ENCOUNTER — Other Ambulatory Visit: Payer: Self-pay | Admitting: Family Medicine

## 2022-12-01 DIAGNOSIS — F32A Depression, unspecified: Secondary | ICD-10-CM

## 2022-12-02 ENCOUNTER — Encounter: Payer: Self-pay | Admitting: Family Medicine

## 2022-12-02 DIAGNOSIS — F32A Depression, unspecified: Secondary | ICD-10-CM

## 2022-12-02 MED ORDER — ESCITALOPRAM OXALATE 20 MG PO TABS: 20.0000 mg | ORAL_TABLET | Freq: Every day | ORAL | 1 refills | Status: DC

## 2022-12-02 MED ORDER — BUPROPION HCL ER (XL) 300 MG PO TB24: 300.0000 mg | ORAL_TABLET | Freq: Every day | ORAL | 1 refills | Status: DC

## 2023-01-06 NOTE — Progress Notes (Unsigned)
[facility] [address], [address] [place], [place] [address] [phone]  Date:  01/07/2023   Name:  Shamea Ferrales   DOB:  1995-06-24   MRN:  [id number]  PCP:  Pearline Cables, MD    Chief Complaint: No chief complaint on file.   History of Present Illness:  Deshawna Melnyk is a 27 y.o. very pleasant female patient who presents with the following:  Patient seen today with concern of migraine headaches Most recent visit with myself was in December of last year-at that time she was dealing with some right upper quadrant pain Right upper quadrant ultrasound was normal we ended up getting a CT abdomen pelvis in January of this year which was also negative  Flu vaccine Recommend COVID booster Pap screening Patient Active Problem List   Diagnosis Date Noted   Rectal bleeding 05/30/2022   Rectal pain 05/30/2022   Bowel habit changes 05/30/2022   Generalized abdominal pain 05/30/2022   Major depressive disorder, single episode, moderate degree (HCC) 10/07/2015   Major depressive disorder, single episode, moderate (HCC) 10/07/2015    Past Medical History:  Diagnosis Date   Allergy    Seasonal allergies   Anxiety    Chronic headaches    Depression     Past Surgical History:  Procedure Laterality Date   THERAPEUTIC ABORTION      Social History   Tobacco Use   Smoking status: Never   Smokeless tobacco: Never  Vaping Use   Vaping status: Never Used  Substance Use Topics   Alcohol use: No    Alcohol/week: 0.0 standard drinks of alcohol   Drug use: Yes    Frequency: 1.0 times per week    Types: Marijuana    Comment: Marijuana bowl two times a week for nausea     Family History  Problem Relation Age of Onset   Diabetes Mother    Hyperlipidemia Father    Colon polyps Father    Diabetes Father    Mental illness Sister    Mental illness Brother    Liver cancer Maternal Grandmother    Diabetes Maternal Grandmother     Cancer Paternal Grandfather        type unkown    Allergies  Allergen Reactions   Soy Allergy Other (See Comments)    "throat itches"     Medication list has been reviewed and updated.  Current Outpatient Medications on File Prior to Visit  Medication Sig Dispense Refill   buPROPion (WELLBUTRIN XL) 300 MG 24 hr tablet Take 1 tablet (300 mg total) by mouth daily. 90 tablet 1   escitalopram (LEXAPRO) 20 MG tablet Take 1 tablet (20 mg total) by mouth daily. 90 tablet 1   No current facility-administered medications on file prior to visit.    Review of Systems:  As per HPI- otherwise negative.   Physical Examination: There were no vitals filed for this visit. There were no vitals filed for this visit. There is no height or weight on file to calculate BMI. Ideal Body Weight:    GEN: no acute distress. HEENT: Atraumatic, Normocephalic.  Ears and Nose: No external deformity. CV: RRR, No M/G/R. No JVD. No thrill. No extra heart sounds. PULM: CTA B, no wheezes, crackles, rhonchi. No retractions. No resp. distress. No accessory muscle use. ABD: S, NT, ND, +BS. No rebound. No HSM. EXTR: No c/c/e PSYCH: Normally interactive. Conversant.    Assessment and Plan: ***  Signed Abbe Amsterdam, MD

## 2023-01-06 NOTE — Patient Instructions (Incomplete)
It was good to see you again today!  Recommend COVID booster this fall, flu shot if not completed already  Please do come and see me soon for your Pap  I sent in a prescription for Imitrex for migraine headache, try to take it at the first sign of headache pain.  Let me know how this works for you  We also ordered physical therapy for your back pain, I can add on x-rays for rib pain if need be-just let me know

## 2023-01-07 ENCOUNTER — Ambulatory Visit: Payer: BC Managed Care – PPO | Admitting: Family Medicine

## 2023-01-07 VITALS — BP 118/60 | HR 91 | Temp 97.8°F | Resp 18

## 2023-01-07 DIAGNOSIS — G43109 Migraine with aura, not intractable, without status migrainosus: Secondary | ICD-10-CM | POA: Diagnosis not present

## 2023-01-07 DIAGNOSIS — M898X1 Other specified disorders of bone, shoulder: Secondary | ICD-10-CM

## 2023-01-07 MED ORDER — SUMATRIPTAN SUCCINATE 100 MG PO TABS: 100.0000 mg | ORAL_TABLET | ORAL | 2 refills | Status: DC | PRN

## 2023-01-13 ENCOUNTER — Encounter: Payer: Self-pay | Admitting: Family Medicine

## 2023-01-20 ENCOUNTER — Encounter: Payer: Self-pay | Admitting: Family Medicine

## 2023-01-20 DIAGNOSIS — H52223 Regular astigmatism, bilateral: Secondary | ICD-10-CM | POA: Diagnosis not present

## 2023-01-20 DIAGNOSIS — H43393 Other vitreous opacities, bilateral: Secondary | ICD-10-CM | POA: Diagnosis not present

## 2023-01-20 DIAGNOSIS — G43919 Migraine, unspecified, intractable, without status migrainosus: Secondary | ICD-10-CM | POA: Diagnosis not present

## 2023-01-20 DIAGNOSIS — H40013 Open angle with borderline findings, low risk, bilateral: Secondary | ICD-10-CM | POA: Diagnosis not present

## 2023-01-24 ENCOUNTER — Encounter: Payer: Self-pay | Admitting: Family Medicine

## 2023-07-07 ENCOUNTER — Other Ambulatory Visit: Payer: Self-pay | Admitting: Family Medicine

## 2023-07-07 DIAGNOSIS — F32A Depression, unspecified: Secondary | ICD-10-CM

## 2023-07-21 ENCOUNTER — Other Ambulatory Visit: Payer: Self-pay | Admitting: Family Medicine

## 2023-07-21 DIAGNOSIS — F32A Depression, unspecified: Secondary | ICD-10-CM

## 2023-08-11 ENCOUNTER — Encounter: Payer: Self-pay | Admitting: Family Medicine

## 2023-08-12 MED ORDER — ZOLMITRIPTAN 5 MG PO TABS: 5.0000 mg | ORAL_TABLET | ORAL | 0 refills | Status: DC | PRN

## 2023-09-28 ENCOUNTER — Other Ambulatory Visit: Payer: Self-pay | Admitting: Family Medicine

## 2023-11-18 ENCOUNTER — Other Ambulatory Visit: Payer: Self-pay | Admitting: Family Medicine

## 2023-11-18 DIAGNOSIS — F32A Depression, unspecified: Secondary | ICD-10-CM

## 2023-11-18 NOTE — Telephone Encounter (Signed)
 Last written for 30 days in March, not seen since September 2024

## 2023-11-28 ENCOUNTER — Emergency Department (HOSPITAL_COMMUNITY)
Admission: EM | Admit: 2023-11-28 | Discharge: 2023-11-29 | Disposition: A | Discharge status: Home / Self Care | Attending: Emergency Medicine | Admitting: Emergency Medicine

## 2023-11-28 ENCOUNTER — Emergency Department (HOSPITAL_COMMUNITY)

## 2023-11-28 ENCOUNTER — Encounter (HOSPITAL_COMMUNITY): Payer: Self-pay | Admitting: *Deleted

## 2023-11-28 ENCOUNTER — Other Ambulatory Visit: Payer: Self-pay

## 2023-11-28 DIAGNOSIS — S199XXA Unspecified injury of neck, initial encounter: Secondary | ICD-10-CM | POA: Diagnosis not present

## 2023-11-28 DIAGNOSIS — Y92410 Unspecified street and highway as the place of occurrence of the external cause: Secondary | ICD-10-CM | POA: Diagnosis not present

## 2023-11-28 DIAGNOSIS — R519 Headache, unspecified: Secondary | ICD-10-CM | POA: Diagnosis not present

## 2023-11-28 DIAGNOSIS — S0990XA Unspecified injury of head, initial encounter: Secondary | ICD-10-CM | POA: Diagnosis not present

## 2023-11-28 DIAGNOSIS — S060X0A Concussion without loss of consciousness, initial encounter: Secondary | ICD-10-CM | POA: Diagnosis not present

## 2023-11-28 DIAGNOSIS — S060XAA Concussion with loss of consciousness status unknown, initial encounter: Secondary | ICD-10-CM | POA: Insufficient documentation
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
## 2023-11-29 MED ORDER — ACETAMINOPHEN 325 MG PO TABS
650.0000 mg | ORAL_TABLET | Freq: Once | ORAL | Status: AC
  Administered 2023-11-29: 650 mg via ORAL
  Filled 2023-11-29: qty 2

## 2023-11-29 MED ORDER — IBUPROFEN 400 MG PO TABS
600.0000 mg | ORAL_TABLET | Freq: Once | ORAL | Status: AC
  Administered 2023-11-29: 600 mg via ORAL
  Filled 2023-11-29: qty 1
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
## 2023-11-29 NOTE — Discharge Instructions (Signed)
 It was a pleasure taking part in your care.  As discussed, the imaging of your head and neck tonight were reassuring.  I believe you have a concussion.  Please read attached guide concerning concussions.  Please avoid repeat head trauma.  Please avoid high stimulation environments.  Take ibuprofen  or Tylenol  for pain.  Follow-up with Dr. Claudene, concussion specialist, for further care if you feel the need to do so.  Return to the ED with any new symptoms.

## 2023-12-11 ENCOUNTER — Other Ambulatory Visit: Payer: Self-pay | Admitting: Family Medicine

## 2023-12-11 DIAGNOSIS — F32A Depression, unspecified: Secondary | ICD-10-CM

## 2023-12-16 ENCOUNTER — Ambulatory Visit: Payer: Self-pay
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
## 2023-12-17 ENCOUNTER — Encounter: Payer: Self-pay | Admitting: Student

## 2023-12-17 ENCOUNTER — Ambulatory Visit: Admitting: Student

## 2023-12-17 VITALS — BP 110/72 | HR 80 | Temp 98.7°F | Resp 12 | Ht 65.0 in

## 2023-12-17 DIAGNOSIS — M542 Cervicalgia: Secondary | ICD-10-CM | POA: Diagnosis not present

## 2023-12-17 MED ORDER — MELOXICAM 7.5 MG PO TABS: 7.5000 mg | ORAL_TABLET | Freq: Every day | ORAL | 0 refills | Status: AC | PRN

## 2024-03-16 ENCOUNTER — Other Ambulatory Visit: Payer: Self-pay | Admitting: Family Medicine

## 2024-03-16 DIAGNOSIS — F32A Depression, unspecified: Secondary | ICD-10-CM

## 2024-04-15 NOTE — Progress Notes (Signed)
 [facility] [address], [address] [place], [place] [address] [phone]  Date:  04/18/2024   Name:  Donna Hodges   DOB:  1996/04/09   MRN:  [id number]  PCP:  Watt Harlene BROCKS, MD    Chief Complaint: Sore Throat (Onset 3 weeks /I have a feeling its my wisdom teeth)   History of Present Illness:  Donna Hodges is a 29 y.o. very pleasant female patient who presents with the following:  Pt seen today with sore throat and also concern of med refill and pap Last seen by me 9/24 Negative pap in 2018- now old enough for HPV testing   Pap- update today Labs- update today Flu shot- declines HPV series - start today   She tested + for history of mono in 2019  Discussed the use of AI scribe software for clinical note transcription with the patient, who gave verbal consent to proceed.  History of Present Illness Donna Hodges is a 29 year old female who presents with throat pain and impacted wisdom teeth.  She has been experiencing throat pain for the past three weeks, located in the upper throat. The pain is exacerbated by talking but is not associated with swallowing or chewing. No fever or cough during this period.  She is aware of having three impacted wisdom teeth that have barely erupted. She has not undergone a recent dental evaluation to assess her status.  She is currently taking Wellbutrin  and Lexapro  but occasionally forgets to take them. She feels that these medications control her symptoms well when taken consistently.    Patient Active Problem List   Diagnosis Date Noted   Rectal bleeding 05/30/2022   Rectal pain 05/30/2022   Bowel habit changes 05/30/2022   Generalized abdominal pain 05/30/2022   Major depressive disorder, single episode, moderate degree (HCC) 10/07/2015   Major depressive disorder, single episode, moderate (HCC) 10/07/2015    Past Medical History:  Diagnosis Date   Allergy    Seasonal  allergies   Anxiety    Chronic headaches    Depression     Past Surgical History:  Procedure Laterality Date   THERAPEUTIC ABORTION      Social History[1]  Family History  Problem Relation Age of Onset   Diabetes Mother    Hyperlipidemia Father    Colon polyps Father    Diabetes Father    Mental illness Sister    Mental illness Brother    Liver cancer Maternal Grandmother    Diabetes Maternal Grandmother    Cancer Paternal Grandfather        type unkown    Allergies[2]  Medication list has been reviewed and updated.  Medications Ordered Prior to Encounter[3]  Review of Systems:  As per HPI- otherwise negative.   Physical Examination: Vitals:   04/18/24 1302  BP: 116/68  Pulse: 75  Temp: 98.5 F (36.9 C)  SpO2: 98%   Vitals:   04/18/24 1302  Height: 5' 5 (1.651 m)   Body mass index is 32.17 kg/m. Ideal Body Weight: Weight in (lb) to have BMI = 25: 149.9  GEN: no acute distress.  Mild obesity, looks well  HEENT: Atraumatic, Normocephalic.  Bilateral TM wnl, oropharynx normal.  PEERL,EOMI.  She is tender over her last molars B mandible  Ears and Nose: No external deformity. CV: RRR, No M/G/R. No JVD. No thrill. No extra heart sounds. PULM: CTA B, no wheezes, crackles, rhonchi. No  retractions. No resp. distress. No accessory muscle use. ABD: S, NT, ND. No rebound. No HSM. EXTR: No c/c/e PSYCH: Normally interactive. Conversant.  Normal vulva, vagina, cervix   Results for orders placed or performed in visit on 04/18/24  POCT rapid strep A   Collection Time: 04/18/24  1:40 PM  Result Value Ref Range   Rapid Strep A Screen Negative Negative    Assessment and Plan: Depression, unspecified depression type  Thyroid  disorder screening - Plan: TSH  Screening for diabetes mellitus - Plan: Comprehensive metabolic panel with GFR, Hemoglobin A1c  Screening, lipid - Plan: Lipid panel  Screening for deficiency anemia - Plan: CBC  Screening for cervical  cancer - Plan: Cytology - PAP( [facility])  Assessment & Plan Woman's Wellness Visit Routine wellness visit with no major new issues. Discussed HPV vaccination and its benefits in preventing cervical and other cancers. She agreed to start the Gardasil series. - Performed Pap smear. - Ordered routine blood work including cholesterol, thyroid , blood sugar, blood counts, and metabolic profile. - Administered first dose of Gardasil.  Sore throat for three weeks, primarily in the upper throat, exacerbated by talking. No fever or cough. Strep test negative. Possible allergic rhinitis considered. - Recommended trial of Zyrtec or Claritin for one week.  Impacted wisdom teeth Three impacted wisdom teeth with no visible infection. Possible contribution to throat discomfort. - Recommended dental evaluation for impacted wisdom teeth.  Major depressive disorder, single episode, moderate degree. Current medications (Wellbutrin  and Lexapro ) are effective, but adherence is inconsistent due to forgetfulness. - Continue current medications as needed.  Signed Harlene Schroeder, MD     [1]  Social History Tobacco Use   Smoking status: Never   Smokeless tobacco: Never  Vaping Use   Vaping status: Never Used  Substance Use Topics   Alcohol use: No    Alcohol/week: 0.0 standard drinks of alcohol   Drug use: Yes    Frequency: 1.0 times per week    Types: Marijuana    Comment: Marijuana bowl two times a week for nausea   [2]  Allergies Allergen Reactions   Soy Allergy (Obsolete) Other (See Comments)    throat itches   [3]  Current Outpatient Medications on File Prior to Visit  Medication Sig Dispense Refill   buPROPion  (WELLBUTRIN  XL) 300 MG 24 hr tablet TAKE 1 TABLET BY MOUTH EVERY DAY 90 tablet 0   escitalopram  (LEXAPRO ) 20 MG tablet TAKE 1 TABLET BY MOUTH EVERY DAY 90 tablet 0   meloxicam  (MOBIC ) 7.5 MG tablet Take 1 tablet (7.5 mg total) by mouth daily as needed for pain. 20 tablet 0    zolmitriptan  (ZOMIG ) 5 MG tablet TAKE 1 TABLET AS NEEDED FOR MIGRAINE. MAX 10 MG IN 24 HOURS- MAY REPEAT 5 MG IN 2 HOURS IF NEEDED 10 tablet 0   No current facility-administered medications on file prior to visit.   "

## 2024-04-18 ENCOUNTER — Ambulatory Visit: Admitting: Family Medicine

## 2024-04-18 ENCOUNTER — Other Ambulatory Visit (HOSPITAL_COMMUNITY)
Admission: RE | Admit: 2024-04-18 | Discharge: 2024-04-18 | Disposition: A | Discharge status: Home / Self Care | Source: Ambulatory Visit | Attending: Family Medicine | Admitting: Family Medicine

## 2024-04-18 VITALS — BP 116/68 | HR 75 | Temp 98.5°F | Ht 65.0 in

## 2024-04-18 DIAGNOSIS — F32A Depression, unspecified: Secondary | ICD-10-CM | POA: Diagnosis not present

## 2024-04-18 DIAGNOSIS — Z124 Encounter for screening for malignant neoplasm of cervix: Secondary | ICD-10-CM | POA: Insufficient documentation

## 2024-04-18 DIAGNOSIS — Z130 Encounter for screening for diseases of the blood and blood-forming organs and certain disorders involving the immune mechanism: Secondary | ICD-10-CM | POA: Diagnosis not present

## 2024-04-18 DIAGNOSIS — Z131 Encounter for screening for diabetes mellitus: Secondary | ICD-10-CM

## 2024-04-18 DIAGNOSIS — J029 Acute pharyngitis, unspecified: Secondary | ICD-10-CM | POA: Diagnosis not present

## 2024-04-18 DIAGNOSIS — Z1329 Encounter for screening for other suspected endocrine disorder: Secondary | ICD-10-CM

## 2024-04-18 DIAGNOSIS — Z23 Encounter for immunization: Secondary | ICD-10-CM

## 2024-04-18 DIAGNOSIS — Z13220 Encounter for screening for lipoid disorders: Secondary | ICD-10-CM

## 2024-04-18 LAB — POCT RAPID STREP A (OFFICE): Rapid Strep A Screen: NEGATIVE

## 2024-04-18 NOTE — Patient Instructions (Addendum)
 Great to see you today I will be in touch with your labs asap First dose of gardsasil today- 2nd dose in 1-2 months and 3rd dose in 6 months (from today)  Try taking an OTC allergy med like Zyrtec or Claritin for your throat- this may help Also I would recommend setting up a dental appt to discuss getting your wisdom teeth out. This will likely need to be done by oral surgery  Take care!

## 2024-04-19 ENCOUNTER — Encounter: Payer: Self-pay | Admitting: Family Medicine

## 2024-04-19 ENCOUNTER — Other Ambulatory Visit: Payer: Self-pay | Admitting: Family Medicine

## 2024-04-19 DIAGNOSIS — R7401 Elevation of levels of liver transaminase levels: Secondary | ICD-10-CM

## 2024-04-19 LAB — CBC
HCT: 39.9 % (ref 36.0–46.0)
Hemoglobin: 13.5 g/dL (ref 12.0–15.0)
MCHC: 33.9 g/dL (ref 30.0–36.0)
MCV: 81.4 fl (ref 78.0–100.0)
Platelets: 251 K/uL (ref 150.0–400.0)
RBC: 4.91 Mil/uL (ref 3.87–5.11)
RDW: 14 % (ref 11.5–15.5)
WBC: 5.7 K/uL (ref 4.0–10.5)

## 2024-04-19 LAB — COMPREHENSIVE METABOLIC PANEL WITH GFR
ALT: 47 U/L — ABNORMAL HIGH (ref 3–35)
AST: 35 U/L (ref 5–37)
Albumin: 4.4 g/dL (ref 3.5–5.2)
Alkaline Phosphatase: 40 U/L (ref 39–117)
BUN: 14 mg/dL (ref 6–23)
CO2: 29 meq/L (ref 19–32)
Calcium: 9.8 mg/dL (ref 8.4–10.5)
Chloride: 99 meq/L (ref 96–112)
Creatinine, Ser: 0.73 mg/dL (ref 0.40–1.20)
GFR: 111.79 mL/min
Glucose, Bld: 81 mg/dL (ref 70–99)
Potassium: 4 meq/L (ref 3.5–5.1)
Sodium: 136 meq/L (ref 135–145)
Total Bilirubin: 0.2 mg/dL (ref 0.2–1.2)
Total Protein: 7.5 g/dL (ref 6.0–8.3)

## 2024-04-19 LAB — HEMOGLOBIN A1C: Hgb A1c MFr Bld: 5.3 % (ref 4.6–6.5)

## 2024-04-19 LAB — CYTOLOGY - PAP
Comment: NEGATIVE
Diagnosis: NEGATIVE
High risk HPV: NEGATIVE

## 2024-04-19 LAB — LIPID PANEL
Cholesterol: 186 mg/dL (ref 28–200)
HDL: 74.6 mg/dL
LDL Cholesterol: 73 mg/dL (ref 10–99)
NonHDL: 111.89
Total CHOL/HDL Ratio: 2
Triglycerides: 195 mg/dL — ABNORMAL HIGH (ref 10.0–149.0)
VLDL: 39 mg/dL (ref 0.0–40.0)

## 2024-04-19 LAB — TSH: TSH: 0.65 u[IU]/mL (ref 0.35–5.50)
